# Patient Record
Sex: Male | Born: 1955 | Race: White | Hispanic: No | Marital: Married | Smoking: Former smoker
Health system: Southern US, Community
[De-identification: ages and names within clinical notes are randomized; demographics above are authoritative.]

## PROBLEM LIST (undated history)

## (undated) DIAGNOSIS — E785 Hyperlipidemia, unspecified: Secondary | ICD-10-CM

## (undated) DIAGNOSIS — C439 Malignant melanoma of skin, unspecified: Secondary | ICD-10-CM

## (undated) DIAGNOSIS — K2270 Barrett's esophagus without dysplasia: Secondary | ICD-10-CM

## (undated) DIAGNOSIS — Z87442 Personal history of urinary calculi: Secondary | ICD-10-CM

## (undated) DIAGNOSIS — M1990 Unspecified osteoarthritis, unspecified site: Secondary | ICD-10-CM

## (undated) DIAGNOSIS — C4490 Unspecified malignant neoplasm of skin, unspecified: Secondary | ICD-10-CM

## (undated) DIAGNOSIS — C3490 Malignant neoplasm of unspecified part of unspecified bronchus or lung: Secondary | ICD-10-CM

## (undated) DIAGNOSIS — K219 Gastro-esophageal reflux disease without esophagitis: Secondary | ICD-10-CM

## (undated) DIAGNOSIS — F419 Anxiety disorder, unspecified: Secondary | ICD-10-CM

## (undated) HISTORY — PX: ANKLE SURGERY: SHX546

## (undated) HISTORY — DX: Unspecified malignant neoplasm of skin, unspecified: C44.90

## (undated) HISTORY — DX: Malignant melanoma of skin, unspecified: C43.9

## (undated) HISTORY — PX: ESOPHAGOGASTRODUODENOSCOPY: SHX1529

## (undated) HISTORY — DX: Hyperlipidemia, unspecified: E78.5

## (undated) HISTORY — DX: Barrett's esophagus without dysplasia: K22.70

## (undated) HISTORY — PX: OTHER SURGICAL HISTORY: SHX169

---

## 1999-09-12 ENCOUNTER — Inpatient Hospital Stay (HOSPITAL_COMMUNITY): Admission: EM | Admit: 1999-09-12 | Discharge: 1999-09-14 | Payer: Self-pay | Admitting: Emergency Medicine

## 1999-09-12 ENCOUNTER — Encounter: Payer: Self-pay | Admitting: Emergency Medicine

## 2000-10-16 ENCOUNTER — Encounter: Payer: Self-pay | Admitting: Internal Medicine

## 2000-10-16 ENCOUNTER — Ambulatory Visit (HOSPITAL_COMMUNITY): Admission: RE | Admit: 2000-10-16 | Discharge: 2000-10-16 | Payer: Self-pay | Admitting: Internal Medicine

## 2000-10-30 ENCOUNTER — Encounter: Payer: Self-pay | Admitting: Internal Medicine

## 2000-10-30 ENCOUNTER — Ambulatory Visit (HOSPITAL_COMMUNITY): Admission: RE | Admit: 2000-10-30 | Discharge: 2000-10-30 | Payer: Self-pay | Admitting: Internal Medicine

## 2000-11-13 ENCOUNTER — Encounter: Payer: Self-pay | Admitting: Internal Medicine

## 2000-11-13 ENCOUNTER — Encounter: Admission: RE | Admit: 2000-11-13 | Discharge: 2000-11-13 | Payer: Self-pay | Admitting: Internal Medicine

## 2000-12-23 ENCOUNTER — Ambulatory Visit (HOSPITAL_COMMUNITY): Admission: RE | Admit: 2000-12-23 | Discharge: 2000-12-23 | Payer: Self-pay | Admitting: General Surgery

## 2001-01-28 ENCOUNTER — Emergency Department (HOSPITAL_COMMUNITY): Admission: EM | Admit: 2001-01-28 | Discharge: 2001-01-28 | Payer: Self-pay | Admitting: *Deleted

## 2001-01-28 ENCOUNTER — Encounter: Payer: Self-pay | Admitting: *Deleted

## 2001-02-28 ENCOUNTER — Emergency Department (HOSPITAL_COMMUNITY): Admission: EM | Admit: 2001-02-28 | Discharge: 2001-03-01 | Payer: Self-pay | Admitting: *Deleted

## 2001-02-28 ENCOUNTER — Encounter: Payer: Self-pay | Admitting: *Deleted

## 2001-03-31 ENCOUNTER — Ambulatory Visit (HOSPITAL_COMMUNITY): Admission: RE | Admit: 2001-03-31 | Discharge: 2001-03-31 | Payer: Self-pay | Admitting: Urology

## 2001-03-31 ENCOUNTER — Encounter: Payer: Self-pay | Admitting: Urology

## 2001-10-16 ENCOUNTER — Other Ambulatory Visit: Admission: RE | Admit: 2001-10-16 | Discharge: 2001-10-16 | Payer: Self-pay | Admitting: Dermatology

## 2002-01-09 ENCOUNTER — Encounter: Payer: Self-pay | Admitting: Internal Medicine

## 2002-01-09 ENCOUNTER — Encounter: Admission: RE | Admit: 2002-01-09 | Discharge: 2002-01-09 | Payer: Self-pay | Admitting: Internal Medicine

## 2002-01-23 ENCOUNTER — Encounter: Payer: Self-pay | Admitting: Internal Medicine

## 2002-01-23 ENCOUNTER — Encounter: Admission: RE | Admit: 2002-01-23 | Discharge: 2002-01-23 | Payer: Self-pay | Admitting: Internal Medicine

## 2002-02-05 ENCOUNTER — Encounter: Admission: RE | Admit: 2002-02-05 | Discharge: 2002-02-05 | Payer: Self-pay | Admitting: Internal Medicine

## 2002-02-05 ENCOUNTER — Encounter: Payer: Self-pay | Admitting: Internal Medicine

## 2002-12-08 ENCOUNTER — Ambulatory Visit (HOSPITAL_COMMUNITY): Admission: RE | Admit: 2002-12-08 | Discharge: 2002-12-08 | Payer: Self-pay | Admitting: Internal Medicine

## 2002-12-08 ENCOUNTER — Encounter: Payer: Self-pay | Admitting: Internal Medicine

## 2002-12-31 ENCOUNTER — Ambulatory Visit (HOSPITAL_COMMUNITY): Admission: RE | Admit: 2002-12-31 | Discharge: 2002-12-31 | Payer: Self-pay | Admitting: Internal Medicine

## 2004-10-04 ENCOUNTER — Ambulatory Visit: Payer: Self-pay | Admitting: Internal Medicine

## 2005-04-06 ENCOUNTER — Encounter: Admission: RE | Admit: 2005-04-06 | Discharge: 2005-04-06 | Payer: Self-pay | Admitting: Internal Medicine

## 2005-04-27 ENCOUNTER — Encounter: Admission: RE | Admit: 2005-04-27 | Discharge: 2005-04-27 | Payer: Self-pay | Admitting: Internal Medicine

## 2005-08-16 ENCOUNTER — Encounter: Admission: RE | Admit: 2005-08-16 | Discharge: 2005-08-16 | Payer: Self-pay | Admitting: Neurosurgery

## 2005-08-30 ENCOUNTER — Encounter: Admission: RE | Admit: 2005-08-30 | Discharge: 2005-08-30 | Payer: Self-pay | Admitting: Neurosurgery

## 2006-02-08 ENCOUNTER — Ambulatory Visit (HOSPITAL_COMMUNITY): Admission: RE | Admit: 2006-02-08 | Discharge: 2006-02-08 | Payer: Self-pay | Admitting: Internal Medicine

## 2006-02-08 ENCOUNTER — Encounter (INDEPENDENT_AMBULATORY_CARE_PROVIDER_SITE_OTHER): Payer: Self-pay | Admitting: Specialist

## 2006-02-08 ENCOUNTER — Ambulatory Visit: Payer: Self-pay | Admitting: Internal Medicine

## 2006-09-16 ENCOUNTER — Ambulatory Visit (HOSPITAL_COMMUNITY): Admission: RE | Admit: 2006-09-16 | Discharge: 2006-09-16 | Payer: Self-pay | Admitting: Urology

## 2007-04-04 ENCOUNTER — Ambulatory Visit (HOSPITAL_COMMUNITY): Admission: RE | Admit: 2007-04-04 | Discharge: 2007-04-04 | Payer: Self-pay | Admitting: Internal Medicine

## 2008-10-22 ENCOUNTER — Ambulatory Visit (HOSPITAL_COMMUNITY): Admission: RE | Admit: 2008-10-22 | Discharge: 2008-10-22 | Payer: Self-pay | Admitting: Urology

## 2009-01-10 ENCOUNTER — Ambulatory Visit (HOSPITAL_COMMUNITY): Admission: RE | Admit: 2009-01-10 | Discharge: 2009-01-10 | Payer: Self-pay | Admitting: Family Medicine

## 2010-04-15 ENCOUNTER — Emergency Department (HOSPITAL_COMMUNITY): Admission: EM | Admit: 2010-04-15 | Discharge: 2010-04-15 | Payer: Self-pay | Admitting: Emergency Medicine

## 2010-08-26 ENCOUNTER — Encounter: Payer: Self-pay | Admitting: Internal Medicine

## 2010-10-09 ENCOUNTER — Other Ambulatory Visit (HOSPITAL_COMMUNITY): Payer: Self-pay | Admitting: Internal Medicine

## 2010-10-11 ENCOUNTER — Encounter (HOSPITAL_COMMUNITY): Payer: Self-pay

## 2010-10-11 ENCOUNTER — Ambulatory Visit (HOSPITAL_COMMUNITY)
Admission: RE | Admit: 2010-10-11 | Discharge: 2010-10-11 | Disposition: A | Discharge status: Home / Self Care | Payer: 59 | Source: Ambulatory Visit | Attending: Internal Medicine | Admitting: Internal Medicine

## 2010-10-11 ENCOUNTER — Other Ambulatory Visit (HOSPITAL_COMMUNITY): Payer: Self-pay

## 2010-10-11 DIAGNOSIS — R1031 Right lower quadrant pain: Secondary | ICD-10-CM | POA: Insufficient documentation

## 2010-10-11 DIAGNOSIS — Q619 Cystic kidney disease, unspecified: Secondary | ICD-10-CM | POA: Insufficient documentation

## 2010-10-11 DIAGNOSIS — K5730 Diverticulosis of large intestine without perforation or abscess without bleeding: Secondary | ICD-10-CM | POA: Insufficient documentation

## 2010-10-11 DIAGNOSIS — R1032 Left lower quadrant pain: Secondary | ICD-10-CM | POA: Insufficient documentation

## 2010-10-11 MED ORDER — IOHEXOL 300 MG/ML  SOLN
100.0000 mL | Freq: Once | INTRAMUSCULAR | Status: AC | PRN
  Administered 2010-10-11: 100 mL via INTRAVENOUS

## 2010-11-20 ENCOUNTER — Ambulatory Visit (INDEPENDENT_AMBULATORY_CARE_PROVIDER_SITE_OTHER): Payer: 59 | Admitting: Internal Medicine

## 2010-11-20 DIAGNOSIS — K219 Gastro-esophageal reflux disease without esophagitis: Secondary | ICD-10-CM

## 2010-12-22 NOTE — Op Note (Signed)
NAME:  Ryan Hines, Ryan Hines                         ACCOUNT NO.:  [id number]   MEDICAL RECORD NO.:  [id number]                   PATIENT TYPE:  AMB   LOCATION:  DAY                                  FACILITY:  [facility]   PHYSICIAN:  Lionel December, M.D.                 DATE OF BIRTH:  11-08-1955   DATE OF PROCEDURE:  12/31/2002  DATE OF DISCHARGE:                                 OPERATIVE REPORT   PROCEDURE:  Esophagogastroduodenoscopy.   INDICATIONS FOR PROCEDURE:  The patient is a 54 year old Caucasian male who  was diagnosed with short-segment Barrett's esophagus two years ago; however,  he states he has no symptoms of GERD and presently is on no therapy.  He is  undergoing EGD both for diagnostic and surveillance purposes.  If indeed he  has a Barrett's esophagus, he needs to be on chronic maintenance therapy  with PPI.  The procedure was reviewed with the patient, and informed consent  was obtained.   PREOPERATIVE MEDICATIONS:  Cetacaine spray for pharyngeal topical  anesthesia, Demerol 50 mg IV, Versed 7 mg IV in divided doses.   INSTRUMENT USED:  Olympus video system.   FINDINGS:  The procedure was performed in the endoscopy suite.  The  patient's vital signs and O2 saturations were monitored during the procedure  and remained stable.  The patient was placed in the left lateral position.  The endoscope was passed via the oropharynx without any difficulty into the  esophagus.   Esophagus:  The mucosa of the proximal and middle third was normal.  Distally, he had a short Barrett segment.  The squamocolumnar junction was  wavy with a sawtooth appearance.  The proximal margin was estimated to be  between 34 and 35 cm.  The highest point was felt to be 34 cm.  The salmon-  colored mucosa involved circumferentially for about 2 cm.  The GEJ was  estimated to be at 37, and the diaphragmatic hiatus was at 40 cm from the  incisors.  There were no erosions, ulcerations, or masses.   Multiple  pictures were taken for documentation, and biopsies were taken looking for  dyplasia on the way out.   Stomach:  It was empty and distended very well with insufflation.  The folds  of the proximal stomach were normal.  Examination of the mucosa at gastric  body, antrum, pyloric channel, as well as angularis, fundus, and cardia was  normal.   Duodenum:  Examination of the bulb and second part of the duodenum was also  normal.  The endoscope was withdrawn, and on the way out, multiple biopsies  were taken from this Barrett's segment.  The patient tolerated the procedure  well.   FINAL DIAGNOSES:  1. Short-segment Barrett's esophagus.  It is 3 cm in length, but part of it     has a sawtooth appearance.  2. Small sliding hiatal hernia which was  3 cm in length.  3. Diaphragmatic hiatus was at 40.  Gastroesophageal junction estimated to     be 37.  The most proximal margin of Barrett's was at 34.  4. Normal examination of stomach and duodenum.   RECOMMENDATIONS:  1. Antireflux measures reinforced.  2. Will start him on omeprazole 20 mg p.o. q.a.m.  Prescription given for a     month with 11 refills.  3. I will contact the patient with biopsy results.  If there is no     dysplasia, he will return for another exam in three years from now.                                               Lionel December, M.D.    NR/MEDQ  D:  12/31/2002  T:  12/31/2002  Job:  [id number]   cc:   Kingsley Callander. Ouida Sills, M.D.  [address]  [place]  [place] [address]  Fax: [phone]

## 2010-12-22 NOTE — Procedures (Signed)
[place]  Patient:    Ryan Hines, Ryan Hines                      MRN: [id number] Proc. Date: 09/14/99 Adm. Date:  [id number] Disc. Date: [id number] Attending:  Leanord Hawking                           Procedure Report  PROCEDURE: 1. Left heart catheterization. 2. Coronary angiography.  INDICATIONS FOR PROCEDURE:  Persistent chest pain typical of angina.  PROCEDURE:  After obtaining written informed consent the patient was brought to the cardiac catheterization lab in a post absorptive state.  Preoperative sedation as achieved using IV Versed and IV Benadryl.  Both groins were prepped and draped n the usual sterile fashion.  Local anesthesia was achieved using 1% Xylocaine. 6 [place] Hemostasis sheath was placed into the right femoral artery using modified Seldinger technique.  Selective coronary angiography was performed using a JL4, JR4 Judkins catheter.  Non-ionic contrast was used and was hand injected.  A single planned ventriculogram was performed in the RAO position using a 6 French straight catheter.  Non-ionic contrast was used and was powered injected.  All catheter exchanges were made over a guidewire and a hemostasis sheath was placed with each catheter exchange.  Following review of the film there was no identifiable coronary artery disease.  The hemostasis sheath was removed.  Hemostasis was achieved using digital pressure.  FINDINGS:  The aortic pressure was 104/18.  LV pressure is 110/67.  There was no gradient noted on pull back.  Single planned ventriculogram revealed normal wall motion.  There was no mitral  regurgitation noted.  CORONARY ANGIOGRAPHY:  The left main coronary artery bifurcated into the left anterior descending and a circumflex vessel.  There was no significant disease n the left main coronary artery.  LEFT ANTERIOR DESCENDING:  The left anterior descending gave rise to a moderate  size  D1, small D2, small D3, small D4, and a distant apical recurrent branch. There was no significant disease in the left anterior descending nor its branches.  CIRCUMFLEX VESSELS:  The circumflex vessel gave rise to a large OM1, large OM2 nd a distant PL branch.  It was codominant for the posterior circulation.  There was no significant disease in the circumflex branch.  RIGHT CORONARY ARTERY:  The right coronary artery is codominant and gave rise to a small RV marginal and a moderate sized PDA.  IMPRESSION: 1. Normal coronary arteries. 2. Normal LV gram.  RECOMMENDATION:  Consider other etiologies for his chest pain. DD:  09/14/99 TD:  09/14/99 Job: [id number]

## 2010-12-22 NOTE — Discharge Summary (Signed)
[place]. [facility]  Patient:    Ryan Hines, Ryan Hines                      MRN: [id number] Adm. Date:  [id number] Disc. Date: 09/13/99 Attending:  Leanord Hawking CC:         Dr. Huntley Dec, [place]                           Discharge Summary  DISCHARGE DIAGNOSES:  1. Atypical chest pain.  2. Costochondritis.  3. Hyperlipidemia.  HISTORY:  Mr. Leitz is a 55 year old white male admitted after about 12 hours f left interior sharp chest wall pain described as a 5/10 initially, radiating to the left shoulder.  No associated shortness of breath, diaphoresis, nausea or vomiting. The pain seemed to go down from about 5/10 to 2/10 after one sublingual nitroglycerin.  EKG was unremarkable.  Chest x-ray was unremarkable.  The patient described no heartburn or reflux disease or dysphagia symptoms.  On exam, was only remarkable for pinpointed left chest wall anterior tenderness which seemed to reproduce his pain.  The patients CPKs were unremarkable.  Troponins were negative.  He ruled out for an MI.  CBC and BMET were unremarkable as well. The patient did have urine which showed some calcium oxalate crystals and microscopic hematuria.  This may indicate urolithiasis.  However, the patient was completely asymptomatic.  The patient is to be discharged today pending a negative stress Cardiolite.  DISCHARGE MEDICATIONS:  1. Pravachol 40 mg q.h.s. (which he had been on before).  2. Naprosyn 500 mg b.i.d. p.r.n. chest wall pain.  I will make a request for stress Cardiolite to Holly Bodily, nurse practitioner for Dr. Corliss Marcus. DD:  09/13/99 TD:  09/13/99 Job: [id number]

## 2010-12-22 NOTE — H&P (Signed)
[place]. [facility]  Patient:    Ryan Hines, Ryan Hines                      MRN: [id number] Adm. Date:  [id number] Attending:  Leanord Hawking CC:         Kingsley Callander. Nicki Reaper, M.D., [place], [place]                         History and Physical  CHIEF COMPLAINT:  Chest pain.  HISTORY OF PRESENT ILLNESS:  [name] is a very pleasant 55 year old white male patient.  His primary care doctor is Dr. Kingsley Callander. [place], III in [place].  He is admitted tonight via unassigned rotation list.  His last visit with Dr. Ouida Sills was two to three months ago.  He does not have history of chest pain.  He experienced the onset of chest pain  about 9 oclock this morning as he was waking up.  He describes focal left anterior chest pain, which is sharp in nature.  The pain is not positional or pleuritic. No associated shortness of breath, palpitations, nausea, vomiting or diaphoresis. The pain does radiate towards his left shoulder.  Initially, pain was rated 5/10 in  severity and fell to 2-3/10 with sublingual nitroglycerin given once in the ER. He still rates the pain as 2/10.  ALLERGIES:  None.  PRESENT MEDICATIONS:   Pravachol 40 mg q.h.s.  PAST MEDICAL HISTORY: 1. Hyperlipidemia. 2. Tobacco abuse. 3. Obesity. 4. History of crack abuse.  Apparently discontinued about six years ago after two    stays at [facility].  SURGERIES:  None.  FAMILY HISTORY:  Father died at age 48 with an MI.  Mother, age 3, is healthy.  Three brothers are healthy.  SOCIAL HISTORY:  Does not drink alcohol now.  Smokes two packs a day and has smoked for 26 years.  He has been married for 25 years.  Two children, ages 102 and 46, are healthy.  He works at [employer].  PHYSICAL EXAMINATION:  VITAL SIGNS:  Temperature 98.9, blood pressure 142/86, pulse 81, respiratory rate 18, O2 saturation 97%.  GENERAL:  He is an alert, cooperative white male in no acute  distress.  HEENT:  Normocephalic, atraumatic.  PERRL.  EOMs are intact.  Fundi okay.  TMs okay.  Posterior oropharynx is clear.  NECK:  Supple, without JVD or carotid bruits.  CHEST:  Clear to auscultation bilaterally.  He does have focal tenderness to palpation along the left anterior chest wall.  HEART:  Regular rate and rhythm, without murmur, rub or gallop.  ABDOMEN:  Soft, obese, nontender and nondistended.  No organomegaly, masses or bruits.  Benign exam.  GU:  Exam deferred upon admission.  RECTAL:  Exam deferred upon admission.  EXTREMITIES:  Dorsalis pedis pulse 2+ bilaterally.  No edema.  NEUROLOGIC:  Exam is nonfocal.  LABORATORY AND X-RAY FINDINGS:  Chest x-ray is okay.  EKG reveals normal sinus rhythm without ischemic changes.  CBC, basic metabolic profile, troponin and CK-MB are okay/normal.  ASSESSMENT AND PLAN:  Atypical chest pain with multiple cardiac risk factors including family history, male gender, hyperlipidemia, tobacco abuse and obesity. Will admit to telemetry and rule out for myocardial infarction with serial enzymes and electrocardiograms.  He will likely need a stress Cardiolite for further evaluation, if he does rule out for myocardial infarction.  I strongly encouraged complete smoking cessation and he acknowledges the fact  he should quit smoking.   I have discussed admission plans with the patient and his wife; they are agreeable. DD:  09/12/99 TD:  09/13/99 Job: [id number]

## 2010-12-22 NOTE — Op Note (Signed)
Ryan Hines, Ryan Hines               ACCOUNT NO.:  [id number]   MEDICAL RECORD NO.:  [id number]          PATIENT TYPE:  AMB   LOCATION:  DAY                           FACILITY:  [facility]   PHYSICIAN:  Lionel December, M.D.    DATE OF BIRTH:  [birth year]   DATE OF PROCEDURE:  02/08/2006  DATE OF DISCHARGE:                                  PROCEDURE NOTE   PROCEDURE:  Esophagogastroduodenoscopy followed by colonoscopy.   ENDOSCOPIST:  Lionel December, M.D.   INDICATIONS:  Ryan Hines is a 55 year old Caucasian male who has known Barrett's  esophagus and he is therefore undergoing surveillance EGD.  His last exam  was in May 2004.  He is on antireflux measures and Nexium and he says his  heartburn is very well-controlled.  He denies dysphagia.  He also has  history of colonic polyps and undergoing surveillance colonoscopy.  His last  exam was 4 or 5 years ago.  Procedure and risks were reviewed with the  patient and informed consent was obtained.   MEDICATIONS FOR CONSCIOUS SEDATION:  Benzocaine spray for oropharyngeal  topical anesthesia, Demerol 50 mg IV, Versed 10 mg IV.   FINDINGS:  Procedure performed in endoscopy suite.  The patient's vital  signs and O2 SATs were monitored during the procedure and remained stable.   PROCEDURE:   1. ESOPHAGOGASTRODUODENOSCOPY:  The patient was placed in the left lateral  decubitus position and Olympus videoscope was passed via oropharynx without  any difficulty into esophagus.   ESOPHAGUS:  Mucosa of the proximal and middle third was normal.  Proximal  margin of Barrett's was at 36.5 cm from the incisors.  Proximal margin was  serrated.  Barrett's mucosa extended down to GE junction, which was at 40  cm.  There were multiple small squamous islands.  No stricture or ulceration  were noted.  Hiatus was at 40.   STOMACH:  It was empty and distended very well with insufflation.  Folds in  the proximal stomach were normal.  Examination of the mucosa at body,  antrum  and pyloric channel as well as angularis, fundus and cardia was normal.   DUODENUM:  Bulbar mucosa was normal.  Scope was passed into the second part  of the duodenum, where mucosa and folds were normal.  Endoscope was  withdrawn.   Multiple biopsies were taken from Barrett's mucosa and submitted in 1  container.  Endoscope was withdrawn.  The patient was prepared for procedure  #2.   2. COLONOSCOPY:  Rectal examination was performed.  No abnormality was noted  on external or digital exam.  Olympus videoscope was placed in the rectum  and advanced under direct vision into sigmoid colon and beyond.  Preparation  was satisfactory.  A few scattered diverticula were noted in the sigmoid  colon, all of which were small.  The scope was passed into the cecum, which  was identified by appendiceal orifice and ileocecal valve.  There was a  small diverticulum at ascending colon just above the ileocecal valve.  As  the scope was withdrawn, colonic mucosa was carefully examined and  no polyps  and/or tumor masses were noted.  Rectal mucosa was normal.  Scope was  retroflexed to examine anorectal junction and small hemorrhoids were noted  below the dentate line.  Endoscope was straightened and withdrawn.  The  patient tolerated the procedure well.   FINAL DIAGNOSES:  A 3.5-cm-long segment of Barrett's esophagus, which is  essentially unchanged since previous study of about 3 years ago, May 2004.  Small sliding hiatal hernia .  Multiple biopsies taken, looking for  dysplasia.   Small sliding hiatal hernia.   A few scattered diverticula at sigmoid colon with 1 at ascending colon and  external hemorrhoids.  No evidence of recurrent polyps.   RECOMMENDATIONS:  I will be contacting patient with biopsy results.  He will  continue antireflux measures and Nexium as before.      Lionel December, M.D.  Electronically Signed     NR/MEDQ  D:  02/08/2006  T:  02/08/2006  Job:  [id number]   cc:    Kingsley Callander. Ouida Sills, MD  Fax: [phone]

## 2011-08-17 ENCOUNTER — Other Ambulatory Visit: Payer: Self-pay | Admitting: Physician Assistant

## 2011-09-26 ENCOUNTER — Other Ambulatory Visit: Payer: Self-pay | Admitting: Physician Assistant

## 2011-12-27 ENCOUNTER — Telehealth (INDEPENDENT_AMBULATORY_CARE_PROVIDER_SITE_OTHER): Payer: Self-pay | Admitting: *Deleted

## 2011-12-27 MED ORDER — ESOMEPRAZOLE MAGNESIUM 40 MG PO CPDR: 40.0000 mg | DELAYED_RELEASE_CAPSULE | Freq: Every day | ORAL | Status: DC

## 2011-12-27 NOTE — Telephone Encounter (Signed)
Patient needs refill nexium 40 mg # 30 daily

## 2011-12-27 NOTE — Telephone Encounter (Signed)
Rx eprescribed to pharmacy

## 2012-01-16 ENCOUNTER — Encounter (INDEPENDENT_AMBULATORY_CARE_PROVIDER_SITE_OTHER): Payer: Self-pay | Admitting: *Deleted

## 2012-01-28 ENCOUNTER — Ambulatory Visit (INDEPENDENT_AMBULATORY_CARE_PROVIDER_SITE_OTHER): Payer: 59 | Admitting: Internal Medicine

## 2012-01-28 ENCOUNTER — Encounter (INDEPENDENT_AMBULATORY_CARE_PROVIDER_SITE_OTHER): Payer: Self-pay | Admitting: Internal Medicine

## 2012-01-28 VITALS — BP 110/70 | HR 72 | Temp 99.2°F | Ht 73.0 in | Wt 255.8 lb

## 2012-01-28 DIAGNOSIS — E785 Hyperlipidemia, unspecified: Secondary | ICD-10-CM

## 2012-01-28 DIAGNOSIS — K2270 Barrett's esophagus without dysplasia: Secondary | ICD-10-CM

## 2012-01-28 NOTE — Patient Instructions (Addendum)
Continue nexium. OV in 1 yrs.

## 2012-01-28 NOTE — Progress Notes (Signed)
Subjective:     Patient ID: Ryan Hines, male   DOB: 06-27-1956, 56 y.o.   MRN: [id number]  HPI Says he is not having any reflux. Presently taking Nexium for his GERD.  See below. Appetite is good. No weight loss.. He has actually gained 10 pounds since his last visit. He smokes very few cigarettes. He has a BM 2-3 times a day. No rectal bleeding.  Goes to [facility] in July surveillance EGD for hx of Barrett's.  Known history of Barrett's esophagus with persistent low grade dysplasia. Sent to Dr.  Alvia Grove of [place], [place].  He has u ndergone 4 EGDs. He had RFA therapy on his first 2 endoscopies.  His last exam wa in January of 2012 and he was told that he did not have any Barrett's per Dr. Karilyn Cota dictated notes.   Review of Systems see hpi Current Outpatient Prescriptions  Medication Sig Dispense Refill  . ALPRAZolam (XANAX) 1 MG tablet Take 1 mg by mouth at bedtime as needed.      Marland Kitchen aspirin 81 MG tablet Take 81 mg by mouth daily.      . diclofenac (VOLTAREN) 75 MG EC tablet Take 75 mg by mouth 2 (two) times daily.      Marland Kitchen esomeprazole (NEXIUM) 40 MG capsule Take 1 capsule (40 mg total) by mouth daily.  30 capsule  7  . simvastatin (ZOCOR) 40 MG tablet Take 40 mg by mouth every evening.      . Tamsulosin HCl (FLOMAX) 0.4 MG CAPS Take by mouth.       History reviewed. No pertinent past surgical history. History   Social History  . Marital Status: Married    Spouse Name: N/A    Number of Children: N/A  . Years of Education: N/A   Occupational History  . Not on file.   Social History Main Topics  . Smoking status: Current Everyday Smoker  . Smokeless tobacco: Not on file   Comment: ver few.  . Alcohol Use: Not on file  . Drug Use: Not on file  . Sexually Active: Not on file   Other Topics Concern  . Not on file   Social History Narrative  . No narrative on file   Family Status  Relation Status Death Age  . Mother Alive     good health  . Father Deceased    Prostate cancer  . Brother Deceased     One deceased from throat cancer. One in good contion.   Allergies no known allergies          Objective:   Physical Exam Filed Vitals:   01/28/12 1016  Height: 6\' 1"  (1.854 m)  Weight: 255 lb 12.8 oz (116.03 kg)  Alert and oriented. Skin warm and dry. Oral mucosa is moist.   . Sclera anicteric, conjunctivae is pink. Thyroid not enlarged. No cervical lymphadenopathy. Lungs clear. Heart regular rate and rhythm.  Abdomen is soft. Bowel sounds are positive. No hepatomegaly. No abdominal masses felt. No tenderness. Abdomen is obese  No edema to lower extremities.  .       Assessment:   GERD controlled a this time.. Barrett's esophagus without evidence of on last endoscopy in July of 2012.    Plan:    Continue Nexium. Keep appt at [facility] for surveillance EGD. . OV in 1 yr.

## 2012-06-23 ENCOUNTER — Encounter (INDEPENDENT_AMBULATORY_CARE_PROVIDER_SITE_OTHER): Payer: Self-pay

## 2012-08-30 ENCOUNTER — Other Ambulatory Visit (INDEPENDENT_AMBULATORY_CARE_PROVIDER_SITE_OTHER): Payer: Self-pay | Admitting: Internal Medicine

## 2012-11-17 ENCOUNTER — Other Ambulatory Visit: Payer: Self-pay | Admitting: Dermatology

## 2012-12-18 ENCOUNTER — Other Ambulatory Visit: Payer: Self-pay | Admitting: Dermatology

## 2013-01-08 ENCOUNTER — Encounter (INDEPENDENT_AMBULATORY_CARE_PROVIDER_SITE_OTHER): Payer: Self-pay | Admitting: *Deleted

## 2013-01-27 ENCOUNTER — Ambulatory Visit (INDEPENDENT_AMBULATORY_CARE_PROVIDER_SITE_OTHER): Payer: 59 | Admitting: Internal Medicine

## 2013-03-02 ENCOUNTER — Ambulatory Visit (INDEPENDENT_AMBULATORY_CARE_PROVIDER_SITE_OTHER): Payer: 59 | Admitting: Internal Medicine

## 2013-03-03 ENCOUNTER — Encounter (INDEPENDENT_AMBULATORY_CARE_PROVIDER_SITE_OTHER): Payer: Self-pay | Admitting: Internal Medicine

## 2013-03-03 ENCOUNTER — Ambulatory Visit (INDEPENDENT_AMBULATORY_CARE_PROVIDER_SITE_OTHER): Payer: 59 | Admitting: Internal Medicine

## 2013-03-03 VITALS — BP 142/70 | HR 70 | Temp 97.9°F | Ht 72.0 in | Wt 272.2 lb

## 2013-03-03 DIAGNOSIS — K2270 Barrett's esophagus without dysplasia: Secondary | ICD-10-CM

## 2013-03-03 NOTE — Patient Instructions (Addendum)
OV in 1 yr. Continue the Nexium. 

## 2013-03-03 NOTE — Progress Notes (Signed)
Subjective:     Patient ID: Ryan Hines, male   DOB: 10/03/1955, 57 y.o.   MRN: [id number]  HPI Hx of complicated GERD.  Ryan Hines is a 57 yr old male here today for f/u of his Barrett's esophagus. HPI Says he is not having any reflux. Presently taking Nexium for his GERD. See below.  Appetite is good. No weight loss. He has gained 22 pounds since he quit smoking. He has a BM 2-3 times a day. No rectal bleeding.   He goes back to [facility] in September fo ra surveillance EGD for Barrett's. Acid reflux is controlled.    Known history of Barrett's esophagus with persistent low grade dysplasia. Sent to Dr. Alvia Grove of [place], [place]. He has undergone 4 EGDs. He had RFA therapy on his first 2 endoscopies. We do not have records from [facility]  Review of Systems See hpi Current Outpatient Prescriptions  Medication Sig Dispense Refill  . ALPRAZolam (XANAX) 1 MG tablet Take 1 mg by mouth at bedtime as needed.      Marland Kitchen aspirin 81 MG tablet Take 81 mg by mouth daily.      . diclofenac (VOLTAREN) 75 MG EC tablet Take 75 mg by mouth 2 (two) times daily.      Marland Kitchen NEXIUM 40 MG capsule take 1 capsule by mouth once daily  30 each  11  . simvastatin (ZOCOR) 40 MG tablet Take 40 mg by mouth every evening.      . Tamsulosin HCl (FLOMAX) 0.4 MG CAPS Take by mouth.       No current facility-administered medications for this visit.   Past Medical History  Diagnosis Date  . Barrett's esophagus   . Hyperlipidemia    History reviewed. No pertinent past surgical history.     Objective:   Physical Exam  Filed Vitals:   03/03/13 1122  BP: 142/70  Pulse: 70  Temp: 97.9 F (36.6 C)  Height: 6' (1.829 m)  Weight: 272 lb 3.2 oz (123.469 kg)   Alert and oriented. Skin warm and dry. Oral mucosa is moist.   . Sclera anicteric, conjunctivae is pink. Thyroid not enlarged. No cervical lymphadenopathy. Lungs clear. Heart regular rate and rhythm.  Abdomen is soft. Bowel sounds are positive. No  hepatomegaly. No abdominal masses felt. Abdomen is obese No tenderness.  1+ edema to lower extremities.        Assessment:    Barrett's esophagus. He seems to be doing well.  No acid reflux.    Plan:    OV in 1 yr. Keep apt with [facility]. If any problems he will call our office.

## 2013-03-10 ENCOUNTER — Ambulatory Visit (INDEPENDENT_AMBULATORY_CARE_PROVIDER_SITE_OTHER): Payer: 59 | Admitting: Internal Medicine

## 2013-03-10 ENCOUNTER — Encounter (INDEPENDENT_AMBULATORY_CARE_PROVIDER_SITE_OTHER): Payer: Self-pay | Admitting: Internal Medicine

## 2013-03-10 VITALS — BP 132/82 | HR 52 | Ht 72.0 in | Wt 270.1 lb

## 2013-03-10 DIAGNOSIS — R109 Unspecified abdominal pain: Secondary | ICD-10-CM

## 2013-03-10 DIAGNOSIS — C439 Malignant melanoma of skin, unspecified: Secondary | ICD-10-CM | POA: Insufficient documentation

## 2013-03-10 DIAGNOSIS — C4490 Unspecified malignant neoplasm of skin, unspecified: Secondary | ICD-10-CM | POA: Insufficient documentation

## 2013-03-10 NOTE — Patient Instructions (Addendum)
CT abdomen/pelvis with CM. Further recommendations to follow. Patient has pain medication at home.

## 2013-03-10 NOTE — Progress Notes (Signed)
Subjective:     Patient ID: Ryan Hines, male   DOB: Sep 05, 1955, 57 y.o.   MRN: [id number]  HPIPresents today with c/o rt and left flank pain. Right flank is worse than the left. Pain for a couple of days. Denies any bending over.  No fever associated with symptoms. Rates pain at a 5.  Started all of a sudden. No urinary symptoms. Feel like someone hit him with a ball bat. BMs are normal. No melena or bright red rectal bleeding.  Hx of similar episode in 2010. Please see CT for 2010.    10/22/2008 CT abdomen/pelvis with CM: flank pain, hematuria. IMPRESSION:  Cyst at inferior pole of left kidney, 3.4 cm diameter, showing no  complicating features.  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  No acute upper abdominal abnormalities    02/08/2006 EGD/Colonoscopy: Hx of polyps. patient tolerated the procedure well.  FINAL DIAGNOSES: A 3.5-cm-long segment of Barrett's esophagus, which is  essentially unchanged since previous study of about 3 years ago, May 2004.  Small sliding hiatal hernia . Multiple biopsies taken, looking for  dysplasia.  Small sliding hiatal hernia.  A few scattered diverticula at sigmoid colon with 1 at ascending colon and  external hemorrhoids. No evidence of recurrent polyps.   Review of Systems see hpi     History reviewed. No pertinent past surgical history.  No Known Allergies Current Outpatient Prescriptions on File Prior to Visit  Medication Sig Dispense Refill  . ALPRAZolam (XANAX) 1 MG tablet Take 1 mg by mouth at bedtime as needed.      Marland Kitchen aspirin 81 MG tablet Take 81 mg by mouth daily.      . diclofenac (VOLTAREN) 75 MG EC tablet Take 75 mg by mouth 2 (two) times daily.      Marland Kitchen NEXIUM 40 MG capsule take 1 capsule by mouth once daily  30 each  11  . simvastatin (ZOCOR) 40 MG tablet Take 40 mg by mouth every evening.      . Tamsulosin HCl (FLOMAX) 0.4 MG CAPS Take by mouth.       No current  facility-administered medications on file prior to visit.    Objective:   Physical Exam  Filed Vitals:   03/10/13 1033  BP: 132/82  Pulse: 52  Height: 6' (1.829 m)  Weight: 270 lb 1.6 oz (122.517 kg)  Alert and oriented. Skin warm and dry. Oral mucosa is moist.   . Sclera anicteric, conjunctivae is pink. Thyroid not enlarged. No cervical lymphadenopathy. Lungs clear. Heart regular rate and rhythm.  Abdomen is soft. Bowel sounds are positive. No hepatomegaly. No abdominal masses felt.Tenderness rt and left flank.  No edema to lower extremities.        Assessment:    flank pain. Doubt this is GI origin. I will however order a CT to be sure he does not have a kidney stone. Last colonoscopy in 2007 and was normal.     Plan:    CT abdomen/pelvis with CM. Further recommendations to follow. Rx for Vicodin offered. Patient declined. He has a refill on an Rx for this at home.

## 2013-03-12 ENCOUNTER — Telehealth (INDEPENDENT_AMBULATORY_CARE_PROVIDER_SITE_OTHER): Payer: Self-pay | Admitting: Internal Medicine

## 2013-03-12 NOTE — Telephone Encounter (Signed)
Opened in error

## 2013-03-13 ENCOUNTER — Ambulatory Visit (HOSPITAL_COMMUNITY): Payer: 59

## 2013-03-16 ENCOUNTER — Ambulatory Visit (HOSPITAL_COMMUNITY): Admission: RE | Admit: 2013-03-16 | Payer: 59 | Source: Ambulatory Visit

## 2013-08-26 ENCOUNTER — Other Ambulatory Visit (INDEPENDENT_AMBULATORY_CARE_PROVIDER_SITE_OTHER): Payer: Self-pay | Admitting: Internal Medicine

## 2013-12-21 ENCOUNTER — Other Ambulatory Visit: Payer: Self-pay

## 2014-02-10 ENCOUNTER — Encounter (INDEPENDENT_AMBULATORY_CARE_PROVIDER_SITE_OTHER): Payer: Self-pay | Admitting: *Deleted

## 2014-04-09 ENCOUNTER — Ambulatory Visit (INDEPENDENT_AMBULATORY_CARE_PROVIDER_SITE_OTHER): Payer: 59 | Admitting: Internal Medicine

## 2014-04-09 ENCOUNTER — Other Ambulatory Visit (INDEPENDENT_AMBULATORY_CARE_PROVIDER_SITE_OTHER): Payer: Self-pay | Admitting: *Deleted

## 2014-04-09 ENCOUNTER — Telehealth (INDEPENDENT_AMBULATORY_CARE_PROVIDER_SITE_OTHER): Payer: Self-pay | Admitting: *Deleted

## 2014-04-09 ENCOUNTER — Encounter (INDEPENDENT_AMBULATORY_CARE_PROVIDER_SITE_OTHER): Payer: Self-pay | Admitting: Internal Medicine

## 2014-04-09 VITALS — BP 130/82 | HR 64 | Temp 98.1°F | Ht 72.0 in | Wt 263.9 lb

## 2014-04-09 DIAGNOSIS — Z1211 Encounter for screening for malignant neoplasm of colon: Secondary | ICD-10-CM

## 2014-04-09 DIAGNOSIS — R195 Other fecal abnormalities: Secondary | ICD-10-CM

## 2014-04-09 DIAGNOSIS — K625 Hemorrhage of anus and rectum: Secondary | ICD-10-CM | POA: Insufficient documentation

## 2014-04-09 MED ORDER — PEG-KCL-NACL-NASULF-NA ASC-C 100 G PO SOLR: 1.0000 | Freq: Once | ORAL | Status: DC

## 2014-04-09 NOTE — Telephone Encounter (Signed)
Patient needs movi prep 

## 2014-04-09 NOTE — Progress Notes (Signed)
   Subjective:    Patient ID: Ryan Hines, male    DOB: 08/27/1955, 58 y.o.   MRN: [id number]  HPI Seen at [facility] recently for a routine physical in July. Rectal exam revealed +blood. Patient has seen blood on occasion. If he eats sesame seeds or nature bars or anything high fiber, he will see blood. He underwent a  EGD with RFA therapy at [facility] 3-4 yrs ago.  Last EGD this year at [facility] which he reports was normal.  Appetite is okay. Has lost 7-8 pounds from walking. (270 last year's office visit) There is no dysphagia.  Usually has a BM 1-2 a day. Occasionally see blood. Sometimes he has bilateral abdominal pain (later). GERD controlled with Nexium   EGD/Colonoscopy 2007. FINAL DIAGNOSES: A 3.5-cm-long segment of Barrett's esophagus, which is  essentially unchanged since previous study of about 3 years ago, May 2004.  Small sliding hiatal hernia . Multiple biopsies taken, looking for  dysplasia.  Small sliding hiatal hernia.  A few scattered diverticula at sigmoid colon with 1 at ascending colon and  external hemorrhoids. No evidence of recurrent polyps. ESOPHAGUS, BIOPSIES: INTESTINAL METAPLASIA CONSISTENT WITH BARRETT' S ESOPHAGUS ASSOCIATED WITH FOCAL GLANDULAR ATYPIA. SEE    Review of Systems Past Medical History  Diagnosis Date  . Barrett's esophagus   . Hyperlipidemia   . Melanoma     face  . Skin cancer     facial    History reviewed. No pertinent past surgical history.  No Known Allergies  Current Outpatient Prescriptions on File Prior to Visit  Medication Sig Dispense Refill  . ALPRAZolam (XANAX) 1 MG tablet Take 1 mg by mouth at bedtime as needed.      Marland Kitchen aspirin 81 MG tablet Take 81 mg by mouth daily.      Marland Kitchen NEXIUM 40 MG capsule take 1 capsule by mouth once daily  30 capsule  11  . simvastatin (ZOCOR) 40 MG tablet Take 40 mg by mouth every evening.      . Tamsulosin HCl (FLOMAX) 0.4 MG CAPS Take by mouth.       No current  facility-administered medications on file prior to visit.        Objective:   Physical Exam  Filed Vitals:   04/09/14 0904  BP: 130/82  Pulse: 64  Temp: 98.1 F (36.7 C)  Height: 6' (1.829 m)  Weight: 263 lb 14.4 oz (119.704 kg)    Alert and oriented. Skin warm and dry. Oral mucosa is moist.   . Sclera anicteric, conjunctivae is pink. Thyroid not enlarged. No cervical lymphadenopathy. Bilateral wheezes. Heart regular rate and rhythm.  Abdomen is soft. Bowel sounds are positive. No hepatomegaly. No abdominal masses felt. No tenderness.  No edema to lower extremities.         Assessment & Plan:  Guaiac positive stool on recent physical exam by PCP. Colonic AVM, polyp, ulcer, neoplasm needs to be ruled out.

## 2014-04-09 NOTE — Patient Instructions (Signed)
Colonoscopy.  The risks and benefits such as perforation, bleeding, and infection were reviewed with the patient and is agreeable. 

## 2014-04-14 ENCOUNTER — Encounter (HOSPITAL_COMMUNITY): Payer: Self-pay | Admitting: Pharmacy Technician

## 2014-04-30 ENCOUNTER — Ambulatory Visit (HOSPITAL_COMMUNITY)
Admission: RE | Admit: 2014-04-30 | Discharge: 2014-04-30 | Disposition: A | Discharge status: Home / Self Care | Payer: 59 | Source: Ambulatory Visit | Attending: Internal Medicine | Admitting: Internal Medicine

## 2014-04-30 ENCOUNTER — Encounter (HOSPITAL_COMMUNITY)
Admission: RE | Disposition: A | Discharge status: Home / Self Care | Payer: Self-pay | Source: Ambulatory Visit | Attending: Internal Medicine

## 2014-04-30 ENCOUNTER — Encounter (HOSPITAL_COMMUNITY): Payer: Self-pay | Admitting: *Deleted

## 2014-04-30 DIAGNOSIS — Z87891 Personal history of nicotine dependence: Secondary | ICD-10-CM | POA: Insufficient documentation

## 2014-04-30 DIAGNOSIS — C44319 Basal cell carcinoma of skin of other parts of face: Secondary | ICD-10-CM | POA: Insufficient documentation

## 2014-04-30 DIAGNOSIS — K644 Residual hemorrhoidal skin tags: Secondary | ICD-10-CM | POA: Insufficient documentation

## 2014-04-30 DIAGNOSIS — E785 Hyperlipidemia, unspecified: Secondary | ICD-10-CM | POA: Insufficient documentation

## 2014-04-30 DIAGNOSIS — Z86010 Personal history of colonic polyps: Secondary | ICD-10-CM

## 2014-04-30 DIAGNOSIS — Z79899 Other long term (current) drug therapy: Secondary | ICD-10-CM | POA: Insufficient documentation

## 2014-04-30 DIAGNOSIS — D126 Benign neoplasm of colon, unspecified: Secondary | ICD-10-CM | POA: Insufficient documentation

## 2014-04-30 DIAGNOSIS — K921 Melena: Secondary | ICD-10-CM | POA: Insufficient documentation

## 2014-04-30 DIAGNOSIS — R195 Other fecal abnormalities: Secondary | ICD-10-CM

## 2014-04-30 DIAGNOSIS — K5730 Diverticulosis of large intestine without perforation or abscess without bleeding: Secondary | ICD-10-CM | POA: Diagnosis not present

## 2014-04-30 DIAGNOSIS — Z7982 Long term (current) use of aspirin: Secondary | ICD-10-CM | POA: Insufficient documentation

## 2014-04-30 HISTORY — PX: COLONOSCOPY: SHX5424

## 2014-04-30 HISTORY — DX: Gastro-esophageal reflux disease without esophagitis: K21.9

## 2014-04-30 SURGERY — COLONOSCOPY
Anesthesia: Moderate Sedation

## 2014-04-30 MED ORDER — SODIUM CHLORIDE 0.9 % IV SOLN
INTRAVENOUS | Status: DC
  Administered 2014-04-30: 12:00:00 via INTRAVENOUS

## 2014-04-30 MED ORDER — MEPERIDINE HCL 50 MG/ML IJ SOLN
INTRAMUSCULAR | Status: DC | PRN
  Administered 2014-04-30 (×2): 25 mg via INTRAVENOUS

## 2014-04-30 MED ORDER — MIDAZOLAM HCL 5 MG/5ML IJ SOLN
INTRAMUSCULAR | Status: DC | PRN
Start: 2014-04-30 — End: 2014-04-30
  Administered 2014-04-30 (×2): 2 mg via INTRAVENOUS
  Administered 2014-04-30: 1 mg via INTRAVENOUS
  Administered 2014-04-30: 3 mg via INTRAVENOUS
  Administered 2014-04-30: 2 mg via INTRAVENOUS

## 2014-04-30 MED ORDER — MIDAZOLAM HCL 5 MG/5ML IJ SOLN
INTRAMUSCULAR | Status: AC
  Filled 2014-04-30: qty 10

## 2014-04-30 MED ORDER — MEPERIDINE HCL 50 MG/ML IJ SOLN
INTRAMUSCULAR | Status: AC
  Filled 2014-04-30: qty 1

## 2014-04-30 MED ORDER — STERILE WATER FOR IRRIGATION IR SOLN
Status: DC | PRN
  Administered 2014-04-30: 13:00:00

## 2014-04-30 NOTE — Op Note (Signed)
COLONOSCOPY PROCEDURE REPORT  PATIENT:  Ryan Hines  MR#:  [id number] Birthdate:  [birth year], 58 y.o., male Endoscopist:  Dr. Rogene Houston, MD Referred By:  Dr. Purvis Kilts, MD  Procedure Date: 04/30/2014  Procedure:   Colonoscopy  Indications:  Patient is a 58 year old Caucasian male who is heme-positive stools and history of colonic polyps.  Informed Consent:  The procedure and risks were reviewed with the patient and informed consent was obtained.  Medications:  Demerol 50 mg IV Versed 10 mg IV  Description of procedure:  After a digital rectal exam was performed, that colonoscope was advanced from the anus through the rectum and colon to the area of the cecum, ileocecal valve and appendiceal orifice. The cecum was deeply intubated. These structures were well-seen and photographed for the record. From the level of the cecum and ileocecal valve, the scope was slowly and cautiously withdrawn. The mucosal surfaces were carefully surveyed utilizing scope tip to flexion to facilitate fold flattening as needed. The scope was pulled down into the rectum where a thorough exam including retroflexion was performed.  Findings:   Prep excellent. Three small polyps ablated and submitted together. 2 are located at transverse colon and one at splenic flexure. One was cold snared and the others were biopsied. Another small polyp could not be found and available to be removed. Multiple diverticula at sigmoid colon. Normal rectal mucosa. Hemorrhoids below the dentate line.   Therapeutic/Diagnostic Maneuvers Performed:  See above  Complications:  None  Cecal Withdrawal Time:  22 minutes  Impression:  Examination performed to cecum. Multiple diverticula sigmoid colon. Three small polyps were ablated as above and submitted together. Fourth small polyp could not be found on the way out. External hemorrhoids.  Recommendations:  Standard instructions given. I will contact patient  with biopsy results and further recommendations.  Valeska Haislip U  04/30/2014 1:34 PM  CC: Dr. Hilma Favors, Betsy Coder, MD & Dr. Rayne Du ref. provider found

## 2014-04-30 NOTE — Discharge Instructions (Signed)
Resume usual medications and high fiber diet. No driving for 24 hours. Physician will call with biopsy results   High-Fiber Diet Fiber is found in fruits, vegetables, and grains. A high-fiber diet encourages the addition of more whole grains, legumes, fruits, and vegetables in your diet. The recommended amount of fiber for adult males is 38 g per day. For adult females, it is 25 g per day. Pregnant and lactating women should get 28 g of fiber per day. If you have a digestive or bowel problem, ask your caregiver for advice before adding high-fiber foods to your diet. Eat a variety of high-fiber foods instead of only a select few type of foods.  PURPOSE  To increase stool bulk.  To make bowel movements more regular to prevent constipation.  To lower cholesterol.  To prevent overeating. WHEN IS THIS DIET USED?  It may be used if you have constipation and hemorrhoids.  It may be used if you have uncomplicated diverticulosis (intestine condition) and irritable bowel syndrome.  It may be used if you need help with weight management.  It may be used if you want to add it to your diet as a protective measure against atherosclerosis, diabetes, and cancer. SOURCES OF FIBER  Whole-grain breads and cereals.  Fruits, such as apples, oranges, bananas, berries, prunes, and pears.  Vegetables, such as green peas, carrots, sweet potatoes, beets, broccoli, cabbage, spinach, and artichokes.  Legumes, such split peas, soy, lentils.  Almonds. FIBER CONTENT IN FOODS Starches and Grains / Dietary Fiber (g)  Cheerios, 1 cup / 3 g  Corn Flakes cereal, 1 cup / 0.7 g  Rice crispy treat cereal, 1 cup / 0.3 g  Instant oatmeal (cooked),  cup / 2 g  Frosted wheat cereal, 1 cup / 5.1 g  Brown, long-grain rice (cooked), 1 cup / 3.5 g  White, long-grain rice (cooked), 1 cup / 0.6 g  Enriched macaroni (cooked), 1 cup / 2.5 g Legumes / Dietary Fiber (g)  Baked beans (canned, plain, or  vegetarian),  cup / 5.2 g  Kidney beans (canned),  cup / 6.8 g  Pinto beans (cooked),  cup / 5.5 g Breads and Crackers / Dietary Fiber (g)  Plain or honey graham crackers, 2 squares / 0.7 g  Saltine crackers, 3 squares / 0.3 g  Plain, salted pretzels, 10 pieces / 1.8 g  Whole-wheat bread, 1 slice / 1.9 g  White bread, 1 slice / 0.7 g  Raisin bread, 1 slice / 1.2 g  Plain bagel, 3 oz / 2 g  Flour tortilla, 1 oz / 0.9 g  Corn tortilla, 1 small / 1.5 g  Hamburger or hotdog bun, 1 small / 0.9 g Fruits / Dietary Fiber (g)  Apple with skin, 1 medium / 4.4 g  Sweetened applesauce,  cup / 1.5 g  Banana,  medium / 1.5 g  Grapes, 10 grapes / 0.4 g  Orange, 1 small / 2.3 g  Raisin, 1.5 oz / 1.6 g  Melon, 1 cup / 1.4 g Vegetables / Dietary Fiber (g)  Green beans (canned),  cup / 1.3 g  Carrots (cooked),  cup / 2.3 g  Broccoli (cooked),  cup / 2.8 g  Peas (cooked),  cup / 4.4 g  Mashed potatoes,  cup / 1.6 g  Lettuce, 1 cup / 0.5 g  Corn (canned),  cup / 1.6 g  Tomato,  cup / 1.1 g Document Released: 07/23/2005 Document Revised: 01/22/2012 Document Reviewed: 10/25/2011 ExitCare Patient  Information 2015 [place], [place]. This information is not intended to replace advice given to you by your health care provider. Make sure you discuss any questions you have with your health care provider. Colonoscopy, Care After Refer to this sheet in the next few weeks. These instructions provide you with information on caring for yourself after your procedure. Your health care provider may also give you more specific instructions. Your treatment has been planned according to current medical practices, but problems sometimes occur. Call your health care provider if you have any problems or questions after your procedure. WHAT TO EXPECT AFTER THE PROCEDURE  After your procedure, it is typical to have the following:  A small amount of blood in your stool.  Moderate  amounts of gas and mild abdominal cramping or bloating. HOME CARE INSTRUCTIONS  Do not drive, operate machinery, or sign important documents for 24 hours.  You may shower and resume your regular physical activities, but move at a slower pace for the first 24 hours.  Take frequent rest periods for the first 24 hours.  Walk around or put a warm pack on your abdomen to help reduce abdominal cramping and bloating.  Drink enough fluids to keep your urine clear or pale yellow.  You may resume your normal diet as instructed by your health care provider. Avoid heavy or fried foods that are hard to digest.  Avoid drinking alcohol for 24 hours or as instructed by your health care provider.  Only take over-the-counter or prescription medicines as directed by your health care provider.  If a tissue sample (biopsy) was taken during your procedure:  Do not take aspirin or blood thinners for 7 days, or as instructed by your health care provider.  Do not drink alcohol for 7 days, or as instructed by your health care provider.  Eat soft foods for the first 24 hours. SEEK MEDICAL CARE IF: You have persistent spotting of blood in your stool 2-3 days after the procedure. SEEK IMMEDIATE MEDICAL CARE IF:  You have more than a small spotting of blood in your stool.  You pass large blood clots in your stool.  Your abdomen is swollen (distended).  You have nausea or vomiting.  You have a fever.  You have increasing abdominal pain that is not relieved with medicine. Document Released: 03/06/2004 Document Revised: 05/13/2013 Document Reviewed: 03/30/2013 [facility] Patient Information 2015 [place], [place]. This information is not intended to replace advice given to you by your health care provider. Make sure you discuss any questions you have with your health care provider.

## 2014-04-30 NOTE — H&P (Signed)
Ryan Hines is an 58 y.o. male.   Chief Complaint: Patient is here for colonoscopy. HPI: Patient is 58 year old Caucasian male who presents with heme-positive stool. He denies abdominal pain change in his bowel habits or frank bleeding. He has occasional hematochezia. He is on OTC NSAIDs and doesn't take any. He has history of colonic polyps. Polyps removed on colonoscopy for over 12 years ago. Last colonoscopy was in 2007 and no polyps were found. Family history is negative for CRC. He has history of Barrett's esophagus with dysplasia. He underwent RFA ablation at [facility] and remains free of Barrett's. He is getting yearly EGD at [facility].  Past Medical History  Diagnosis Date  . Barrett's esophagus   . Hyperlipidemia   . GERD (gastroesophageal reflux disease)   . Melanoma     face  . Skin cancer     facial-basal cell carcinoma    Past Surgical History  Procedure Laterality Date  . Esophagogastroduodenoscopy    . Skin cancer surgery on face      Family History  Problem Relation Age of Onset  . Colon cancer Neg Hx    Social History:  reports that he has quit smoking. His smoking use included Cigarettes. He has a 70 pack-year smoking history. He does not have any smokeless tobacco history on file. He reports that he does not drink alcohol or use illicit drugs.  Allergies: No Known Allergies  Medications Prior to Admission  Medication Sig Dispense Refill  . ALPRAZolam (XANAX) 1 MG tablet Take 1 mg by mouth at bedtime as needed.      Marland Kitchen aspirin 81 MG tablet Take 81 mg by mouth daily.      Marland Kitchen NEXIUM 40 MG capsule take 1 capsule by mouth once daily  30 capsule  11  . simvastatin (ZOCOR) 40 MG tablet Take 40 mg by mouth every evening.      . Tamsulosin HCl (FLOMAX) 0.4 MG CAPS Take 0.4 mg by mouth daily.         No results found for this or any previous visit (from the past 48 hour(s)). No results found.  ROS  Blood pressure 145/90, temperature 98.3 F (36.8  C), temperature source Oral, resp. rate 20, height 6' (1.829 m), weight 263 lb (119.296 kg), SpO2 94.00%. Physical Exam  Constitutional: He appears well-developed and well-nourished.  HENT:  Mouth/Throat: Oropharynx is clear and moist.  Linear scar below the left eye  Eyes: Conjunctivae are normal. No scleral icterus.  Neck: No thyromegaly present.  Left clavicular abnormality secondary to old fracture  Cardiovascular: Normal rate, regular rhythm and normal heart sounds.   No murmur heard. Respiratory: Effort normal and breath sounds normal.  GI: Soft. He exhibits no distension and no mass. There is no tenderness.  Musculoskeletal: He exhibits no edema.  Lymphadenopathy:    He has no cervical adenopathy.  Neurological: He is alert.  Skin: Skin is warm and dry.     Assessment/Plan Heme-positive stool. History of colonic polyps. Diagnostic colonoscopy.  REHMAN,NAJEEB U 04/30/2014, 12:46 PM

## 2014-05-03 ENCOUNTER — Encounter (HOSPITAL_COMMUNITY): Payer: Self-pay | Admitting: Internal Medicine

## 2014-05-24 ENCOUNTER — Ambulatory Visit (INDEPENDENT_AMBULATORY_CARE_PROVIDER_SITE_OTHER): Payer: 59 | Admitting: Internal Medicine

## 2014-05-27 ENCOUNTER — Other Ambulatory Visit: Payer: Self-pay | Admitting: Dermatology

## 2014-06-01 ENCOUNTER — Encounter (INDEPENDENT_AMBULATORY_CARE_PROVIDER_SITE_OTHER): Payer: Self-pay | Admitting: *Deleted

## 2014-08-03 ENCOUNTER — Encounter (INDEPENDENT_AMBULATORY_CARE_PROVIDER_SITE_OTHER): Payer: Self-pay

## 2014-08-23 ENCOUNTER — Other Ambulatory Visit (HOSPITAL_COMMUNITY): Payer: Self-pay | Admitting: Family Medicine

## 2014-08-23 ENCOUNTER — Ambulatory Visit (HOSPITAL_COMMUNITY)
Admission: RE | Admit: 2014-08-23 | Discharge: 2014-08-23 | Disposition: A | Discharge status: Home / Self Care | Payer: 59 | Source: Ambulatory Visit | Attending: Family Medicine | Admitting: Family Medicine

## 2014-08-23 DIAGNOSIS — Z6834 Body mass index (BMI) 34.0-34.9, adult: Secondary | ICD-10-CM | POA: Insufficient documentation

## 2014-08-23 DIAGNOSIS — Z87891 Personal history of nicotine dependence: Secondary | ICD-10-CM | POA: Diagnosis not present

## 2014-08-23 DIAGNOSIS — J209 Acute bronchitis, unspecified: Secondary | ICD-10-CM | POA: Insufficient documentation

## 2014-08-23 DIAGNOSIS — J984 Other disorders of lung: Secondary | ICD-10-CM | POA: Insufficient documentation

## 2014-08-23 DIAGNOSIS — R0989 Other specified symptoms and signs involving the circulatory and respiratory systems: Secondary | ICD-10-CM | POA: Insufficient documentation

## 2014-08-23 DIAGNOSIS — C3411 Malignant neoplasm of upper lobe, right bronchus or lung: Secondary | ICD-10-CM | POA: Insufficient documentation

## 2014-08-23 DIAGNOSIS — E6609 Other obesity due to excess calories: Secondary | ICD-10-CM | POA: Insufficient documentation

## 2014-08-23 DIAGNOSIS — R042 Hemoptysis: Secondary | ICD-10-CM | POA: Insufficient documentation

## 2014-08-23 DIAGNOSIS — R918 Other nonspecific abnormal finding of lung field: Secondary | ICD-10-CM

## 2014-08-23 DIAGNOSIS — J0100 Acute maxillary sinusitis, unspecified: Secondary | ICD-10-CM | POA: Insufficient documentation

## 2014-08-23 MED ORDER — IOHEXOL 300 MG/ML  SOLN
80.0000 mL | Freq: Once | INTRAMUSCULAR | Status: AC | PRN
  Administered 2014-08-23: 80 mL via INTRAVENOUS

## 2014-08-24 ENCOUNTER — Ambulatory Visit (HOSPITAL_COMMUNITY): Payer: 59

## 2014-08-25 ENCOUNTER — Other Ambulatory Visit (HOSPITAL_COMMUNITY): Payer: Self-pay | Admitting: Oncology

## 2014-08-25 ENCOUNTER — Ambulatory Visit (INDEPENDENT_AMBULATORY_CARE_PROVIDER_SITE_OTHER): Payer: 59 | Admitting: Cardiothoracic Surgery

## 2014-08-25 ENCOUNTER — Encounter: Payer: Self-pay | Admitting: *Deleted

## 2014-08-25 ENCOUNTER — Encounter: Payer: Self-pay | Admitting: Cardiothoracic Surgery

## 2014-08-25 VITALS — BP 124/87 | HR 62 | Resp 16 | Ht 72.0 in | Wt 264.0 lb

## 2014-08-25 DIAGNOSIS — R918 Other nonspecific abnormal finding of lung field: Secondary | ICD-10-CM | POA: Insufficient documentation

## 2014-08-25 DIAGNOSIS — R042 Hemoptysis: Secondary | ICD-10-CM

## 2014-08-25 NOTE — Progress Notes (Signed)
[address]       [place] [address]             [phone]                    [facility] Medical Record #[id number] Date of Birth: August 27, 1955  Referring: Pieter Partridge, MD Primary Care: Purvis Kilts, MD  Chief Complaint:    Chief Complaint  Patient presents with  . Lung Mass    right upper...CT CHEST @ [place]    History of Present Illness:    Ryan Hines 59 y.o. male is seen in the office  today for cough, hemptysis and abnormal CT of chest suspicious for lung carcinoma.   Patient has  very strong smoking history having quit smoking 3 years ago.  He  started feeling having low grade fever in December, was started on course of antibiotics. . Some of the symptoms came back with a intermittent cough productive of blood the past week. There was no associated yellow or green phlegm.  He has not lost weight. His appetite remains adequate. He does not have headaches nausea vomiting or change in bowel habits.  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    One of his brothers died of metastatic head and neck cancer in his late 45s. His other 2 brothers are alive in good health. His father died of heart attack but had extensive prostate cancer one time in his life. His father was 9 years old.  His mother is still living.    Current Activity/ Functional Status:  Patient is independent with mobility/ambulation, transfers, ADL's, IADL's.   Zubrod Score: At the time of surgery this patient's most appropriate activity status/level should be described as: [x]     0    Normal activity, no symptoms []     1    Restricted in physical strenuous activity but ambulatory, able to do out light work []     2    Ambulatory and capable of self care, unable to do work activities, up and about               >50 % of waking hours                               []     3    Only limited self care, in bed greater than 50% of waking hours []     4    Completely disabled, no self care, confined to bed or chair []     5    Moribund   Past Medical History  Diagnosis Date  . Barrett's esophagus   . Hyperlipidemia   . GERD (gastroesophageal reflux disease)   . Melanoma     face  . Skin cancer     facial-basal cell carcinoma    Past Surgical History  Procedure Laterality Date  . Esophagogastroduodenoscopy    . Skin cancer surgery on face    . Colonoscopy N/A 04/30/2014    Procedure: COLONOSCOPY;  Surgeon: Rogene Houston, MD;  Location: [facility];  Service: Endoscopy;  Laterality: N/A;  1200 - moved to 12:25 - Ann to notify pt   Patient has had endoscopic mucosa resection for Barett's by Dr Levada Schilling  Hill  Family History  Problem Relation Age of Onset  . Colon cancer Neg Hx     History   Social History  . Marital Status: Married    Spouse Name: N/A    Number of Children: N/A  . Years of Education: N/A   Occupational History   Works at [employer], no exposure known to asbestosis     Social History Main Topics  . Smoking status: Former Smoker -- 2.00 packs/day for 35 years    Types: Cigarettes  . Smokeless tobacco: Not on file     Comment: ver few.      History  Smoking status  . Former Smoker -- 2.00 packs/day for 35 years  . Types: Cigarettes  Smokeless tobacco  . Not on file    Comment: ver few.    History  Alcohol Use No     No Known Allergies  Current Outpatient Prescriptions  Medication Sig Dispense Refill  . ALPRAZolam (XANAX) 1 MG tablet Take 1 mg by mouth at bedtime as needed.    Marland Kitchen aspirin 81 MG tablet Take 81 mg by mouth daily.    . chlorpheniramine-HYDROcodone (TUSSIONEX) 10-8 MG/5ML LQCR Take 5 mLs by mouth every 12 (twelve) hours as needed for cough.    Marland Kitchen levofloxacin (LEVAQUIN) 500 MG tablet Take 500 mg by mouth daily. X 10 days...ending 09/02/14    . NEXIUM 40 MG capsule  take 1 capsule by mouth once daily 30 capsule 11  . simvastatin (ZOCOR) 40 MG tablet Take 40 mg by mouth every evening.    . Tamsulosin HCl (FLOMAX) 0.4 MG CAPS Take 0.4 mg by mouth daily.      No current facility-administered medications for this visit.     Review of Systems:     Cardiac Review of Systems: Y or N  Chest Pain [ n   ]  Resting SOB [n   ] Exertional SOB  [n  ]  Orthopnea Florencio.Farrier  ]   Pedal Edema [ n  ]    Palpitations [ n ] Syncope  [n  ]   Presyncope [ n  ]  General Review of Systems: [Y] = yes [  ]=no Constitional: recent weight change [n  ];  Wt loss over the last 3 months [   ] anorexia [  ]; fatigue [  ]; nausea [  ]; night sweats [  ]; fever [  ]; or chills [  ];          Dental: poor dentition[  ]; Last Dentist visit:   Eye : blurred vision [  ]; diplopia [   ]; vision changes [  ];  Amaurosis fugax[  ]; Resp: cough [ y ];  wheezing[y  ];  hemoptysis[y  ]; shortness of breath[ n ]; paroxysmal nocturnal dyspnea[n  ]; dyspnea on exertion[n ]; or orthopnea[n ];  GI:  gallstones[  ], vomiting[  ];  dysphagia[  ]; melena[  ];  hematochezia [  ]; heartburn[  ];   Hx of  Colonoscopy[ y ]; GU: kidney stones [  ]; hematuria[  ];   dysuria [  ];  nocturia[  ];  history of     obstruction [  ]; urinary frequency [  ]             Skin: rash, swelling[  ];, hair loss[  ];  peripheral edema[  ];  or itching[  ]; Musculosketetal: myalgias[  ];  joint swelling[  ];  joint erythema[  ];  joint pain[  ];  back pain[  ];  Heme/Lymph: bruising[  ];  bleeding[  ];  anemia[  ];  Neuro: TIA[  ];  headaches[  ];  stroke[  ];  vertigo[  ];  seizures[  ];   paresthesias[  ];  difficulty walking[n  ];  Psych:depression[  ]; anxiety[  ];  Endocrine: diabetes[  ];  thyroid dysfunction[  ];  Immunizations: Flu up to date [ n ]; Pneumococcal up to date [ n ];  Other:  Physical Exam: BP 124/87 mmHg  Pulse 62  Resp 16  Ht 6' (1.829 m)  Wt 264 lb (119.75 kg)  BMI 35.80 kg/m2  SpO2 96%  PHYSICAL  EXAMINATION:  General appearance: alert, cooperative, appears stated age and no distress Neurologic: intact Heart: regular rate and rhythm, S1, S2 normal, no murmur, click, rub or gallop Lungs: wheezes RML Abdomen: soft, non-tender; bowel sounds normal; no masses,  no organomegaly Extremities: extremities normal, atraumatic, no cyanosis or edema and Homans sign is negative, no sign of DVT no cervicasl or supraclavicular adenopathy   Diagnostic Studies & Laboratory data:     Recent Radiology Findings:   Dg Chest 2 View  08/23/2014   CLINICAL DATA:  59 year old male with 4 day history of cough, congestion and hemoptysis. Former smoker.  EXAM: CHEST  2 VIEW  COMPARISON:  Prior chest x-ray 01/10/2009  FINDINGS: Interval development of right hilar masslike fullness. On the lateral view, a rounded density in the right hilar/suprahilar region measures approximately 6 x 5.6 cm. There is wedge-shaped density peripheral leak concerning for postobstructive changes. The heart size is within normal limits. The remainder of the lungs are clear. No pleural effusion or pneumothorax. No acute osseous abnormality. Multi level degenerative spurring throughout the spine.  IMPRESSION: 1. Approximately 6 cm masslike density in the right hilar/suprahilar region with evidence of postobstructive change distally in the right upper lobe. Recommend further evaluation with CT scan of the chest with IV contrast to evaluate for primary bronchogenic carcinoma.   Electronically Signed   By: Jacqulynn Cadet M.D.   On: 08/23/2014 08:44   Ct Chest W Contrast  08/23/2014   CLINICAL DATA:  Lung mass.  Hemoptysis.  Cough and chest congestion.  EXAM: CT CHEST WITH CONTRAST  TECHNIQUE: Multidetector CT imaging of the chest was performed during intravenous contrast administration.  CONTRAST:  2mL OMNIPAQUE IOHEXOL 300 MG/ML  SOLN  COMPARISON:  Chest x-ray dated 08/23/2014  FINDINGS: There is a 7.1 x 5.6 x 4.1 cm mass in the right upper  lobe. The mass invades the right hilum completely obstructing the right upper lobe bronchus and immediately adjacent to the right middle lobe bronchus. The mass wraps around the right main pulmonary artery and extends into the mediastinum.  There is peripheral partial atelectasis of the right upper lobe. Accentuated interstitial markings probably represent lymphatic obstruction. The tumor extends along the minor fissure.  No worrisome mediastinal adenopathy.  The visualized portion of the upper abdomen is normal. No acute osseous abnormalities. No effusions.  Heart size is normal.  The left lung is clear.  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   Electronically Signed   By: Rozetta Nunnery M.D.   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 Patient has plans for further work up including MRI of brain and PET scan. I have recommended proceeding with broncoscopy, EBUS with biopsy and consider placement of porta cath. Will tentative plan for Friday Jan 22.       I spent 45 minutes counseling the patient face to face. The total time spent in the appointment was 80 minutes.  Grace Isaac MD      [address] [place] [address] Office [phone]   Beeper [phone]  08/25/2014 5:02 PM

## 2014-08-26 ENCOUNTER — Other Ambulatory Visit (HOSPITAL_COMMUNITY): Payer: 59

## 2014-08-26 ENCOUNTER — Encounter (HOSPITAL_COMMUNITY): Payer: Self-pay | Admitting: *Deleted

## 2014-08-26 ENCOUNTER — Other Ambulatory Visit: Payer: Self-pay | Admitting: *Deleted

## 2014-08-26 DIAGNOSIS — R918 Other nonspecific abnormal finding of lung field: Secondary | ICD-10-CM

## 2014-08-27 ENCOUNTER — Ambulatory Visit (HOSPITAL_COMMUNITY): Payer: 59 | Admitting: Anesthesiology

## 2014-08-27 ENCOUNTER — Encounter (HOSPITAL_COMMUNITY)
Admission: RE | Disposition: A | Discharge status: Home / Self Care | Payer: Self-pay | Source: Ambulatory Visit | Attending: Cardiothoracic Surgery

## 2014-08-27 ENCOUNTER — Ambulatory Visit (HOSPITAL_COMMUNITY): Payer: 59

## 2014-08-27 ENCOUNTER — Ambulatory Visit (HOSPITAL_COMMUNITY)
Admission: RE | Admit: 2014-08-27 | Discharge: 2014-08-27 | Disposition: A | Discharge status: Home / Self Care | Payer: 59 | Source: Ambulatory Visit | Attending: Cardiothoracic Surgery | Admitting: Cardiothoracic Surgery

## 2014-08-27 ENCOUNTER — Encounter (HOSPITAL_COMMUNITY): Payer: Self-pay | Admitting: *Deleted

## 2014-08-27 DIAGNOSIS — Z85820 Personal history of malignant melanoma of skin: Secondary | ICD-10-CM | POA: Insufficient documentation

## 2014-08-27 DIAGNOSIS — E669 Obesity, unspecified: Secondary | ICD-10-CM | POA: Insufficient documentation

## 2014-08-27 DIAGNOSIS — E785 Hyperlipidemia, unspecified: Secondary | ICD-10-CM | POA: Diagnosis not present

## 2014-08-27 DIAGNOSIS — Z419 Encounter for procedure for purposes other than remedying health state, unspecified: Secondary | ICD-10-CM

## 2014-08-27 DIAGNOSIS — R918 Other nonspecific abnormal finding of lung field: Secondary | ICD-10-CM

## 2014-08-27 DIAGNOSIS — Z87891 Personal history of nicotine dependence: Secondary | ICD-10-CM | POA: Insufficient documentation

## 2014-08-27 DIAGNOSIS — Z6835 Body mass index (BMI) 35.0-35.9, adult: Secondary | ICD-10-CM | POA: Diagnosis not present

## 2014-08-27 DIAGNOSIS — F419 Anxiety disorder, unspecified: Secondary | ICD-10-CM | POA: Diagnosis not present

## 2014-08-27 DIAGNOSIS — C3411 Malignant neoplasm of upper lobe, right bronchus or lung: Secondary | ICD-10-CM | POA: Diagnosis not present

## 2014-08-27 DIAGNOSIS — K219 Gastro-esophageal reflux disease without esophagitis: Secondary | ICD-10-CM | POA: Diagnosis not present

## 2014-08-27 DIAGNOSIS — Z95828 Presence of other vascular implants and grafts: Secondary | ICD-10-CM

## 2014-08-27 HISTORY — PX: VIDEO BRONCHOSCOPY WITH ENDOBRONCHIAL ULTRASOUND: SHX6177

## 2014-08-27 HISTORY — PX: PORTACATH PLACEMENT: SHX2246

## 2014-08-27 HISTORY — DX: Unspecified osteoarthritis, unspecified site: M19.90

## 2014-08-27 HISTORY — DX: Personal history of urinary calculi: Z87.442

## 2014-08-27 HISTORY — DX: Anxiety disorder, unspecified: F41.9

## 2014-08-27 HISTORY — PX: LUNG BIOPSY: SHX5088

## 2014-08-27 LAB — COMPREHENSIVE METABOLIC PANEL
ALBUMIN: 3.6 g/dL (ref 3.5–5.2)
ALT: 38 U/L (ref 0–53)
ANION GAP: 6 (ref 5–15)
AST: 25 U/L (ref 0–37)
Alkaline Phosphatase: 57 U/L (ref 39–117)
BILIRUBIN TOTAL: 0.5 mg/dL (ref 0.3–1.2)
BUN: 18 mg/dL (ref 6–23)
CO2: 32 mmol/L (ref 19–32)
CREATININE: 0.74 mg/dL (ref 0.50–1.35)
Calcium: 8.8 mg/dL (ref 8.4–10.5)
Chloride: 101 mEq/L (ref 96–112)
GLUCOSE: 119 mg/dL — AB (ref 70–99)
POTASSIUM: 4.1 mmol/L (ref 3.5–5.1)
Sodium: 139 mmol/L (ref 135–145)
Total Protein: 6.4 g/dL (ref 6.0–8.3)

## 2014-08-27 LAB — CBC
HEMATOCRIT: 37.1 % — AB (ref 39.0–52.0)
HEMOGLOBIN: 12.3 g/dL — AB (ref 13.0–17.0)
MCH: 29.5 pg (ref 26.0–34.0)
MCHC: 33.2 g/dL (ref 30.0–36.0)
MCV: 89 fL (ref 78.0–100.0)
PLATELETS: 215 10*3/uL (ref 150–400)
RBC: 4.17 MIL/uL — ABNORMAL LOW (ref 4.22–5.81)
RDW: 14.8 % (ref 11.5–15.5)
WBC: 7 10*3/uL (ref 4.0–10.5)

## 2014-08-27 LAB — APTT: APTT: 30 s (ref 24–37)

## 2014-08-27 LAB — PROTIME-INR
INR: 1.04 (ref 0.00–1.49)
Prothrombin Time: 13.7 seconds (ref 11.6–15.2)

## 2014-08-27 SURGERY — BRONCHOSCOPY, WITH EBUS
Anesthesia: General | Site: Chest | Laterality: Right

## 2014-08-27 MED ORDER — FENTANYL CITRATE 0.05 MG/ML IJ SOLN
INTRAMUSCULAR | Status: AC
  Filled 2014-08-27: qty 5

## 2014-08-27 MED ORDER — SUCCINYLCHOLINE CHLORIDE 20 MG/ML IJ SOLN
INTRAMUSCULAR | Status: DC | PRN
  Administered 2014-08-27: 120 mg via INTRAVENOUS

## 2014-08-27 MED ORDER — PROPOFOL 10 MG/ML IV BOLUS
INTRAVENOUS | Status: DC | PRN
  Administered 2014-08-27: 150 mg via INTRAVENOUS

## 2014-08-27 MED ORDER — PROMETHAZINE HCL 25 MG/ML IJ SOLN: 6.2500 mg | INTRAMUSCULAR | Status: DC | PRN

## 2014-08-27 MED ORDER — NEOSTIGMINE METHYLSULFATE 10 MG/10ML IV SOLN
INTRAVENOUS | Status: DC | PRN
  Administered 2014-08-27: 3 mg via INTRAVENOUS

## 2014-08-27 MED ORDER — HEPARIN SOD (PORK) LOCK FLUSH 100 UNIT/ML IV SOLN
INTRAVENOUS | Status: AC
  Filled 2014-08-27: qty 5

## 2014-08-27 MED ORDER — EPHEDRINE SULFATE 50 MG/ML IJ SOLN
INTRAMUSCULAR | Status: AC
  Filled 2014-08-27: qty 1

## 2014-08-27 MED ORDER — LACTATED RINGERS IV SOLN
INTRAVENOUS | Status: DC | PRN
  Administered 2014-08-27 (×2): via INTRAVENOUS

## 2014-08-27 MED ORDER — ONDANSETRON HCL 4 MG/2ML IJ SOLN
INTRAMUSCULAR | Status: AC
  Filled 2014-08-27: qty 2

## 2014-08-27 MED ORDER — GLYCOPYRROLATE 0.2 MG/ML IJ SOLN
INTRAMUSCULAR | Status: DC | PRN
  Administered 2014-08-27: 0.4 mg via INTRAVENOUS

## 2014-08-27 MED ORDER — SODIUM CHLORIDE 0.9 % IJ SOLN
INTRAMUSCULAR | Status: AC
  Filled 2014-08-27: qty 10

## 2014-08-27 MED ORDER — MEPERIDINE HCL 25 MG/ML IJ SOLN: 6.2500 mg | INTRAMUSCULAR | Status: DC | PRN

## 2014-08-27 MED ORDER — STERILE WATER FOR INJECTION IJ SOLN
INTRAMUSCULAR | Status: AC
  Filled 2014-08-27: qty 10

## 2014-08-27 MED ORDER — NEOSTIGMINE METHYLSULFATE 10 MG/10ML IV SOLN
INTRAVENOUS | Status: AC
  Filled 2014-08-27: qty 1

## 2014-08-27 MED ORDER — MIDAZOLAM HCL 2 MG/2ML IJ SOLN
INTRAMUSCULAR | Status: AC
  Filled 2014-08-27: qty 2

## 2014-08-27 MED ORDER — HYDROCODONE-ACETAMINOPHEN 5-325 MG PO TABS: 1.0000 | ORAL_TABLET | Freq: Four times a day (QID) | ORAL | Status: DC | PRN

## 2014-08-27 MED ORDER — ROCURONIUM BROMIDE 100 MG/10ML IV SOLN
INTRAVENOUS | Status: DC | PRN
  Administered 2014-08-27: 20 mg via INTRAVENOUS

## 2014-08-27 MED ORDER — FENTANYL CITRATE 0.05 MG/ML IJ SOLN
INTRAMUSCULAR | Status: DC | PRN
  Administered 2014-08-27 (×2): 50 ug via INTRAVENOUS
  Administered 2014-08-27: 100 ug via INTRAVENOUS
  Administered 2014-08-27: 50 ug via INTRAVENOUS

## 2014-08-27 MED ORDER — ARTIFICIAL TEARS OP OINT
TOPICAL_OINTMENT | OPHTHALMIC | Status: AC
  Filled 2014-08-27: qty 3.5

## 2014-08-27 MED ORDER — LIDOCAINE HCL (CARDIAC) 20 MG/ML IV SOLN
INTRAVENOUS | Status: DC | PRN
  Administered 2014-08-27: 50 mg via INTRAVENOUS

## 2014-08-27 MED ORDER — SODIUM CHLORIDE 0.9 % IR SOLN
Status: DC | PRN
  Administered 2014-08-27: 08:00:00

## 2014-08-27 MED ORDER — PROPOFOL 10 MG/ML IV BOLUS
INTRAVENOUS | Status: AC
  Filled 2014-08-27: qty 20

## 2014-08-27 MED ORDER — CEFAZOLIN SODIUM-DEXTROSE 2-3 GM-% IV SOLR
INTRAVENOUS | Status: DC | PRN
  Administered 2014-08-27: 2 g via INTRAVENOUS

## 2014-08-27 MED ORDER — PHENYLEPHRINE HCL 10 MG/ML IJ SOLN
INTRAMUSCULAR | Status: AC
  Filled 2014-08-27: qty 1

## 2014-08-27 MED ORDER — HEPARIN SOD (PORK) LOCK FLUSH 100 UNIT/ML IV SOLN
INTRAVENOUS | Status: DC | PRN
  Administered 2014-08-27: 500 [IU] via INTRAVENOUS

## 2014-08-27 MED ORDER — GLYCOPYRROLATE 0.2 MG/ML IJ SOLN
INTRAMUSCULAR | Status: AC
  Filled 2014-08-27: qty 3

## 2014-08-27 MED ORDER — CEFAZOLIN SODIUM-DEXTROSE 2-3 GM-% IV SOLR
INTRAVENOUS | Status: AC
  Filled 2014-08-27: qty 50

## 2014-08-27 MED ORDER — SODIUM CHLORIDE 0.9 % IR SOLN
Status: DC | PRN
  Administered 2014-08-27: 30 mL

## 2014-08-27 MED ORDER — SUCCINYLCHOLINE CHLORIDE 20 MG/ML IJ SOLN
INTRAMUSCULAR | Status: AC
  Filled 2014-08-27: qty 1

## 2014-08-27 MED ORDER — MIDAZOLAM HCL 5 MG/5ML IJ SOLN
INTRAMUSCULAR | Status: DC | PRN
  Administered 2014-08-27: 2 mg via INTRAVENOUS

## 2014-08-27 MED ORDER — ONDANSETRON HCL 4 MG/2ML IJ SOLN
INTRAMUSCULAR | Status: DC | PRN
  Administered 2014-08-27: 4 mg via INTRAVENOUS

## 2014-08-27 MED ORDER — EPINEPHRINE HCL 1 MG/ML IJ SOLN
INTRAMUSCULAR | Status: AC
  Filled 2014-08-27: qty 1

## 2014-08-27 MED ORDER — LIDOCAINE HCL (CARDIAC) 20 MG/ML IV SOLN
INTRAVENOUS | Status: AC
  Filled 2014-08-27: qty 5

## 2014-08-27 SURGICAL SUPPLY — 60 items
ADH SKN CLS APL DERMABOND .7 (GAUZE/BANDAGES/DRESSINGS) ×3
BAG DECANTER FOR FLEXI CONT (MISCELLANEOUS) ×4 IMPLANT
BLADE SURG 10 STRL SS (BLADE) ×1 IMPLANT
BLADE SURG 11 STRL SS (BLADE) ×5 IMPLANT
BRUSH CYTOL CELLEBRITY 1.5X140 (MISCELLANEOUS) ×1 IMPLANT
CANISTER SUCTION 2500CC (MISCELLANEOUS) ×8 IMPLANT
CONT SPEC 4OZ CLIKSEAL STRL BL (MISCELLANEOUS) ×4 IMPLANT
COVER PROBE W GEL 5X96 (DRAPES) ×4 IMPLANT
COVER SURGICAL LIGHT HANDLE (MISCELLANEOUS) ×4 IMPLANT
COVER TABLE BACK 60X90 (DRAPES) ×4 IMPLANT
DERMABOND ADVANCED (GAUZE/BANDAGES/DRESSINGS) ×1
DERMABOND ADVANCED .7 DNX12 (GAUZE/BANDAGES/DRESSINGS) ×3 IMPLANT
DRAPE C-ARM 42X72 X-RAY (DRAPES) ×5 IMPLANT
DRAPE CHEST BREAST 15X10 FENES (DRAPES) ×5 IMPLANT
ELECT CAUTERY BLADE 6.4 (BLADE) ×4 IMPLANT
ELECT REM PT RETURN 9FT ADLT (ELECTROSURGICAL) ×4
ELECTRODE REM PT RTRN 9FT ADLT (ELECTROSURGICAL) ×3 IMPLANT
FORCEPS BIOP RJ4 1.8 (CUTTING FORCEPS) ×1 IMPLANT
GAUZE SPONGE 4X4 12PLY STRL (GAUZE/BANDAGES/DRESSINGS) ×8 IMPLANT
GLOVE BIO SURGEON STRL SZ 6.5 (GLOVE) ×12 IMPLANT
GOWN STRL REUS W/ TWL LRG LVL3 (GOWN DISPOSABLE) ×6 IMPLANT
GOWN STRL REUS W/TWL LRG LVL3 (GOWN DISPOSABLE) ×8
GUIDEWIRE UNCOATED ST S 7038 (WIRE) IMPLANT
INTRODUCER 13FR (MISCELLANEOUS) IMPLANT
INTRODUCER COOK 11FR (CATHETERS) IMPLANT
KIT BASIN OR (CUSTOM PROCEDURE TRAY) ×4 IMPLANT
KIT CLEAN ENDO COMPLIANCE (KITS) ×8 IMPLANT
KIT PORT POWER 9.6FR MRI PREA (Catheter) IMPLANT
KIT PORT POWER ISP 8FR (Catheter) IMPLANT
KIT POWER CATH 8FR (Catheter) ×1 IMPLANT
KIT ROOM TURNOVER OR (KITS) ×8 IMPLANT
MARKER SKIN DUAL TIP RULER LAB (MISCELLANEOUS) ×4 IMPLANT
NDL BIOPSY TRANSBRONCH 21G (NEEDLE) IMPLANT
NDL BLUNT 18X1 FOR OR ONLY (NEEDLE) IMPLANT
NDL HYPO 25GX1X1/2 BEV (NEEDLE) ×3 IMPLANT
NEEDLE 22X1 1/2 (OR ONLY) (NEEDLE) IMPLANT
NEEDLE BIOPSY TRANSBRONCH 21G (NEEDLE) IMPLANT
NEEDLE BLUNT 18X1 FOR OR ONLY (NEEDLE) IMPLANT
NEEDLE HYPO 25GX1X1/2 BEV (NEEDLE) ×4 IMPLANT
NEEDLE SYS SONOTIP II EBUSTBNA (NEEDLE) ×4 IMPLANT
NS IRRIG 1000ML POUR BTL (IV SOLUTION) ×8 IMPLANT
OIL SILICONE PENTAX (PARTS (SERVICE/REPAIRS)) ×4 IMPLANT
PACK GENERAL/GYN (CUSTOM PROCEDURE TRAY) ×4 IMPLANT
PAD ARMBOARD 7.5X6 YLW CONV (MISCELLANEOUS) ×16 IMPLANT
SET SHEATH INTRODUCER 10FR (MISCELLANEOUS) IMPLANT
SUT ETHILON 3 0 FSL (SUTURE) ×1 IMPLANT
SUT SILK 2 0 SH (SUTURE) ×4 IMPLANT
SUT VIC AB 3-0 SH 18 (SUTURE) ×4 IMPLANT
SUT VIC AB 3-0 X1 27 (SUTURE) ×1 IMPLANT
SUT VICRYL 4-0 PS2 18IN ABS (SUTURE) ×4 IMPLANT
SYR 20CC LL (SYRINGE) ×8 IMPLANT
SYR 20ML ECCENTRIC (SYRINGE) ×4 IMPLANT
SYR 30ML SLIP (SYRINGE) ×2 IMPLANT
SYR 5ML LUER SLIP (SYRINGE) IMPLANT
SYR CONTROL 10ML LL (SYRINGE) ×4 IMPLANT
TOWEL OR 17X24 6PK STRL BLUE (TOWEL DISPOSABLE) ×8 IMPLANT
TOWEL OR 17X26 10 PK STRL BLUE (TOWEL DISPOSABLE) ×4 IMPLANT
TRAP SPECIMEN MUCOUS 40CC (MISCELLANEOUS) ×4 IMPLANT
TUBE CONNECTING 12X1/4 (SUCTIONS) ×8 IMPLANT
WATER STERILE IRR 1000ML POUR (IV SOLUTION) ×4 IMPLANT

## 2014-08-27 NOTE — Brief Op Note (Signed)
      [address]       [place] [address]             [phone]      08/27/2014  9:49 AM  PATIENT:  Ryan Hines  59 y.o. male  PRE-OPERATIVE DIAGNOSIS:  LUNG MASS  POST-OPERATIVE DIAGNOSIS:  LUNG MASS- final path pending  PROCEDURE:  Procedure(s) with comments: VIDEO BRONCHOSCOPY WITH ENDOBRONCHIAL ULTRASOUND (N/A) - Video Bronchoscopy with mediastinal node biopsy LUNG BIOPSY (N/A) - Video Bronchoscopy with Biopsy INSERTION PORT-A-CATH (Right) - Right Port-a-cath Insertion using ultrasound and fluoroscopy  SURGEON:  Surgeon(s) and Role:    * Grace Isaac, MD - Primary   ANESTHESIA:   general  EBL:  Total I/O In: 1200 [I.V.:1200] Out: -   BLOOD ADMINISTERED:none  DRAINS: none   LOCAL MEDICATIONS USED:  NONE  SPECIMEN:  Source of Specimen:  #7 nodes and right upper lobe bronchus  DISPOSITION OF SPECIMEN:  PATHOLOGY  COUNTS:  YES  DICTATION: .Dragon Dictation  PLAN OF CARE: Discharge to home after PACU  PATIENT DISPOSITION:  PACU - hemodynamically stable.   Delay start of Pharmacological VTE agent (>24hrs) due to surgical blood loss or risk of bleeding:yes   yes

## 2014-08-27 NOTE — Discharge Instructions (Signed)
What to eat:  For your first meals, you should eat lightly; only small meals initially.  If you do not have nausea, you may eat larger meals.  Avoid spicy, greasy and heavy food.    General Anesthesia, Adult, Care After  Refer to this sheet in the next few weeks. These instructions provide you with information on caring for yourself after your procedure. Your health care provider may also give you more specific instructions. Your treatment has been planned according to current medical practices, but problems sometimes occur. Call your health care provider if you have any problems or questions after your procedure.  WHAT TO EXPECT AFTER THE PROCEDURE  After the procedure, it is typical to experience:  Sleepiness.  Nausea and vomiting. HOME CARE INSTRUCTIONS  For the first 24 hours after general anesthesia:  Have a responsible person with you.  Do not drive a car. If you are alone, do not take public transportation.  Do not drink alcohol.  Do not take medicine that has not been prescribed by your health care provider.  Do not sign important papers or make important decisions.  You may resume a normal diet and activities as directed by your health care provider.  Change bandages (dressings) as directed.  If you have questions or problems that seem related to general anesthesia, call the hospital and ask for the anesthetist or anesthesiologist on call. SEEK MEDICAL CARE IF:  You have nausea and vomiting that continue the day after anesthesia.  You develop a rash. SEEK IMMEDIATE MEDICAL CARE IF:  You have difficulty breathing.  You have chest pain.  You have any allergic problems. Document Released: 10/29/2000 Document Revised: 03/25/2013 Document Reviewed: 02/05/2013  [facility] Patient Information 2014 [place], [place].   Flexible Bronchoscopy, Care After Refer to this sheet in the next few weeks. These instructions provide you with information on caring for yourself after your procedure.  Your health care provider may also give you more specific instructions. Your treatment has been planned according to current medical practices, but problems sometimes occur. Call your health care provider if you have any problems or questions after your procedure.  WHAT TO EXPECT AFTER THE PROCEDURE It is normal to have the following symptoms for 24-48 hours after the procedure:  Increased cough. Low-grade fever. Sore throat or hoarse voice. Small streaks of blood in your thick spit (sputum) if tissue samples were taken (biopsy). HOME CARE INSTRUCTIONS  Do not eat or drink anything for 2 hours after your procedure. Your nose and throat were numbed by medicine. If you try to eat or drink before the medicine wears off, food or drink could go into your lungs or you could burn yourself. After the numbness is gone and your cough and gag reflexes have returned, you may eat soft food and drink liquids slowly.  The day after the procedure, you can go back to your normal diet.  You may resume normal activities.  Keep all follow-up visits as directed by your health care provider. It is important to keep all your appointments, especially if tissue samples were taken for testing (biopsy). SEEK IMMEDIATE MEDICAL CARE IF:  You have increasing shortness of breath.  You become light-headed or faint.  You have chest pain.  You have any new concerning symptoms. You cough up more than a small amount of blood. The amount of blood you cough up increases. MAKE SURE YOU: Understand these instructions. Will watch your condition. Will get help right away if you are not doing  well or get worse. Document Released: 02/09/2005 Document Revised: 12/07/2013 Document Reviewed: 03/27/2013 [facility] Patient Information 2015 [place], [place]. This information is not intended to replace advice given to you by your health care provider. Make sure you discuss any questions you have with your health care provider.  Implanted  [facility] Guide An implanted port is a type of central line that is placed under the skin. Central lines are used to provide IV access when treatment or nutrition needs to be given through a person's veins. Implanted ports are used for long-term IV access. An implanted port may be placed because:   You need IV medicine that would be irritating to the small veins in your hands or arms.   You need long-term IV medicines, such as antibiotics.   You need IV nutrition for a long period.   You need frequent blood draws for lab tests.   You need dialysis.  Implanted ports are usually placed in the chest area, but they can also be placed in the upper arm, the abdomen, or the leg. An implanted port has two main parts:   Reservoir. The reservoir is round and will appear as a small, raised area under your skin. The reservoir is the part where a needle is inserted to give medicines or draw blood.   Catheter. The catheter is a thin, flexible tube that extends from the reservoir. The catheter is placed into a large vein. Medicine that is inserted into the reservoir goes into the catheter and then into the vein.  HOW WILL I CARE FOR MY INCISION SITE? Do not get the incision site wet. Bathe or shower as directed by your health care provider.  HOW IS MY PORT ACCESSED? Special steps must be taken to access the port:   Before the port is accessed, a numbing cream can be placed on the skin. This helps numb the skin over the port site.   Your health care provider uses a sterile technique to access the port.  Your health care provider must put on a mask and sterile gloves.  The skin over your port is cleaned carefully with an antiseptic and allowed to dry.  The port is gently pinched between sterile gloves, and a needle is inserted into the port.  Only "non-coring" port needles should be used to access the port. Once the port is accessed, a blood return should be checked. This helps ensure that the  port is in the vein and is not clogged.   If your port needs to remain accessed for a constant infusion, a clear (transparent) bandage will be placed over the needle site. The bandage and needle will need to be changed every week, or as directed by your health care provider.   Keep the bandage covering the needle clean and dry. Do not get it wet. Follow your health care provider's instructions on how to take a shower or bath while the port is accessed.   If your port does not need to stay accessed, no bandage is needed over the port.  WHAT IS FLUSHING? Flushing helps keep the port from getting clogged. Follow your health care provider's instructions on how and when to flush the port. Ports are usually flushed with saline solution or a medicine called heparin. The need for flushing will depend on how the port is used.   If the port is used for intermittent medicines or blood draws, the port will need to be flushed:   After medicines have been given.  After blood has been drawn.   As part of routine maintenance.   If a constant infusion is running, the port may not need to be flushed.  HOW LONG WILL MY PORT STAY IMPLANTED? The port can stay in for as long as your health care provider thinks it is needed. When it is time for the port to come out, surgery will be done to remove it. The procedure is similar to the one performed when the port was put in.  WHEN SHOULD I SEEK IMMEDIATE MEDICAL CARE? When you have an implanted port, you should seek immediate medical care if:   You notice a bad smell coming from the incision site.   You have swelling, redness, or drainage at the incision site.   You have more swelling or pain at the port site or the surrounding area.   You have a fever that is not controlled with medicine. Document Released: 07/23/2005 Document Revised: 05/13/2013 Document Reviewed: 03/30/2013 [facility] Patient Information 2015 [place], [place]. This information is  not intended to replace advice given to you by your health care provider. Make sure you discuss any questions you have with your health care provider. Implanted Port Insertion, Care After Refer to this sheet in the next few weeks. These instructions provide you with information on caring for yourself after your procedure. Your health care provider may also give you more specific instructions. Your treatment has been planned according to current medical practices, but problems sometimes occur. Call your health care provider if you have any problems or questions after your procedure. WHAT TO EXPECT AFTER THE PROCEDURE After your procedure, it is typical to have the following:   Discomfort at the port insertion site. Ice packs to the area will help.  Bruising on the skin over the port. This will subside in 3-4 days. HOME CARE INSTRUCTIONS  After your port is placed, you will get a manufacturer's information card. The card has information about your port. Keep this card with you at all times.   Know what kind of port you have. There are many types of ports available.   Wear a medical alert bracelet in case of an emergency. This can help alert health care workers that you have a port.   The port can stay in for as long as your health care provider believes it is necessary.   A home health care nurse may give medicines and take care of the port.   You or a family member can get special training and directions for giving medicine and taking care of the port at home.  SEEK MEDICAL CARE IF:   Your port does not flush or you are unable to get a blood return.   You have a fever or chills. SEEK IMMEDIATE MEDICAL CARE IF:  You have new fluid or pus coming from your incision.   You notice a bad smell coming from your incision site.   You have swelling, pain, or more redness at the incision or port site.   You have chest pain or shortness of breath. Document Released: 05/13/2013  Document Revised: 07/28/2013 Document Reviewed: 05/13/2013 [facility] Patient Information 2015 [place], [place]. This information is not intended to replace advice given to you by your health care provider. Make sure you discuss any questions you have with your health care provider.

## 2014-08-27 NOTE — H&P (Signed)
[address]       [place] [address]             [phone]                    [name] [place] Medical Record #[id number] Date of Birth: [birth year]  Referring: Pieter Partridge, MD Primary Care: Purvis Kilts, MD  Chief Complaint:    Cough and lung mass   History of Present Illness:    [facility] 59 y.o. male is seen in the office  today for cough, hemptysis and abnormal CT of chest suspicious for lung carcinoma.   Patient has  very strong smoking history having quit smoking 3 years ago.  He  started feeling having low grade fever in December, was started on course of antibiotics. . Some of the symptoms came back with a intermittent cough productive of blood the past week. There was no associated yellow or green phlegm.  He has not lost weight. His appetite remains adequate. He does not have headaches nausea vomiting or change in bowel habits.  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    One of his brothers died of metastatic head and neck cancer in his late 85s. His other 2 brothers are alive in good health. His father died of heart attack but had extensive prostate cancer one time in his life. His father was 63 years old.  His mother is still living.    Current Activity/ Functional Status:  Patient is independent with mobility/ambulation, transfers, ADL's, IADL's.   Zubrod Score: At the time of surgery this patient's most appropriate activity status/level should be described as: [x]     0    Normal activity, no symptoms []     1    Restricted in physical strenuous activity but ambulatory, able to do out light work []     2    Ambulatory and capable of self care, unable to do work activities, up and about               >50 % of waking hours                              []     3    Only limited self care, in bed greater  than 50% of waking hours []     4    Completely disabled, no self care, confined to bed or chair []     5    Moribund   Past Medical History  Diagnosis Date  . Barrett's esophagus   . Hyperlipidemia   . GERD (gastroesophageal reflux disease)   . Melanoma     face  . Skin cancer     facial-basal cell carcinoma  . Anxiety   . History of kidney stones   . Arthritis     Past Surgical History  Procedure Laterality Date  . Esophagogastroduodenoscopy    . Skin cancer surgery on face    . Colonoscopy N/A 04/30/2014    Procedure: COLONOSCOPY;  Surgeon: Rogene Houston, MD;  Location: [facility];  Service: Endoscopy;  Laterality: N/A;  1200 - moved to 12:25 - Ann to notify pt  . Ankle surgery Right     Bone spur  . Kidney stent  Right     Kindey Stones   Patient has had endoscopic mucosa resection for Barett's by Dr Gabriel Earing  Family History  Problem Relation Age of Onset  . Colon cancer Neg Hx     History   Social History  . Marital Status: Married    Spouse Name: N/A    Number of Children: N/A  . Years of Education: N/A   Occupational History   Works at [employer], no exposure known to asbestosis     Social History Main Topics  . Smoking status: Former Smoker -- 2.00 packs/day for 35 years    Types: Cigarettes  . Smokeless tobacco: Not on file     Comment: ver few.      History  Smoking status  . Former Smoker -- 2.00 packs/day for 35 years  . Types: Cigarettes  Smokeless tobacco  . Never Used    Comment: 08/26/14- quit 3 years ago    History  Alcohol Use No     No Known Allergies  No current facility-administered medications for this encounter.     Review of Systems:     Cardiac Review of Systems: Y or N  Chest Pain [ n   ]  Resting SOB [n   ] Exertional SOB  [n  ]  Orthopnea [facility]  ]   Pedal Edema [ n  ]    Palpitations [ n ] Syncope  [n  ]   Presyncope [ n  ]  General Review of Systems: [Y] = yes [  ]=no Constitional: recent weight  change [n  ];  Wt loss over the last 3 months [   ] anorexia [  ]; fatigue [  ]; nausea [  ]; night sweats [  ]; fever [  ]; or chills [  ];          Dental: poor dentition[  ]; Last Dentist visit:   Eye : blurred vision [  ]; diplopia [   ]; vision changes [  ];  Amaurosis fugax[  ]; Resp: cough [ y ];  wheezing[y  ];  hemoptysis[y  ]; shortness of breath[ n ]; paroxysmal nocturnal dyspnea[n  ]; dyspnea on exertion[n ]; or orthopnea[n ];  GI:  gallstones[  ], vomiting[  ];  dysphagia[  ]; melena[  ];  hematochezia [  ]; heartburn[  ];   Hx of  Colonoscopy[ y ]; GU: kidney stones [  ]; hematuria[  ];   dysuria [  ];  nocturia[  ];  history of     obstruction [  ]; urinary frequency [  ]             Skin: rash, swelling[  ];, hair loss[  ];  peripheral edema[  ];  or itching[  ]; Musculosketetal: myalgias[  ];  joint swelling[  ];  joint erythema[  ];  joint pain[  ];  back pain[  ];  Heme/Lymph: bruising[  ];  bleeding[  ];  anemia[  ];  Neuro: TIA[  ];  headaches[  ];  stroke[  ];  vertigo[  ];  seizures[  ];   paresthesias[  ];  difficulty walking[n  ];  Psych:depression[  ]; anxiety[  ];  Endocrine: diabetes[  ];  thyroid dysfunction[  ];  Immunizations: Flu up to date [ n ]; Pneumococcal up to date [ n ];  Other:  Physical Exam: BP 149/85 mmHg  Pulse 54  Temp(Src) 97.9 F (36.6  C) (Oral)  Resp 18  Wt 264 lb (119.75 kg)  SpO2 95%  PHYSICAL EXAMINATION:  General appearance: alert, cooperative, appears stated age and no distress Neurologic: intact Heart: regular rate and rhythm, S1, S2 normal, no murmur, click, rub or gallop Lungs: wheezes RML Abdomen: soft, non-tender; bowel sounds normal; no masses,  no organomegaly Extremities: extremities normal, atraumatic, no cyanosis or edema and Homans sign is negative, no sign of DVT no cervicasl or supraclavicular adenopathy   Diagnostic Studies & Laboratory data:     Recent Radiology Findings:   Dg Chest 2 View  08/23/2014    CLINICAL DATA:  59 year old male with 4 day history of cough, congestion and hemoptysis. Former smoker.  EXAM: CHEST  2 VIEW  COMPARISON:  Prior chest x-ray 01/10/2009  FINDINGS: Interval development of right hilar masslike fullness. On the lateral view, a rounded density in the right hilar/suprahilar region measures approximately 6 x 5.6 cm. There is wedge-shaped density peripheral leak concerning for postobstructive changes. The heart size is within normal limits. The remainder of the lungs are clear. No pleural effusion or pneumothorax. No acute osseous abnormality. Multi level degenerative spurring throughout the spine.  IMPRESSION: 1. Approximately 6 cm masslike density in the right hilar/suprahilar region with evidence of postobstructive change distally in the right upper lobe. Recommend further evaluation with CT scan of the chest with IV contrast to evaluate for primary bronchogenic carcinoma.   Electronically Signed   By: Jacqulynn Cadet M.D.   On: 08/23/2014 08:44   Ct Chest W Contrast  08/23/2014   CLINICAL DATA:  Lung mass.  Hemoptysis.  Cough and chest congestion.  EXAM: CT CHEST WITH CONTRAST  TECHNIQUE: Multidetector CT imaging of the chest was performed during intravenous contrast administration.  CONTRAST:  45mL OMNIPAQUE IOHEXOL 300 MG/ML  SOLN  COMPARISON:  Chest x-ray dated 08/23/2014  FINDINGS: There is a 7.1 x 5.6 x 4.1 cm mass in the right upper lobe. The mass invades the right hilum completely obstructing the right upper lobe bronchus and immediately adjacent to the right middle lobe bronchus. The mass wraps around the right main pulmonary artery and extends into the mediastinum.  There is peripheral partial atelectasis of the right upper lobe. Accentuated interstitial markings probably represent lymphatic obstruction. The tumor extends along the minor fissure.  No worrisome mediastinal adenopathy.  The visualized portion of the upper abdomen is normal. No acute osseous abnormalities. No  effusions.  Heart size is normal.  The left lung is clear.  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   Electronically Signed   By: Rozetta Nunnery M.D.   On: 08/23/2014 16:02    PET scan done at [facility] last night: IMPRESSION:  Hypermetabolic right hilar mass causing occlusion of the right upper lobe bronchus with associated postobstructive changes. Single mildly hypermetabolic right paratracheal lymph node.   Result Narrative  INDICATION: R91.8: Other nonspecific abnormal finding of lung field C43.30: Malignant melanoma of unspecified part of face  TECHNIQUE: 98.3 millicuries of J-82 FDG was administered intravenously. PET imaging was obtained from the skull vertex through both feet. CT images were obtained for attenuation correction and localization purposes. Glucose level was 104 MG/DL. Time from  injection to scan was 64 minutes.  Comparison none.  FINDINGS:  Hypermetabolic right hilar mass measuring 4.4 x 6.37 m with a max SUV of 17.2. Associated occlusion of the right  upper lobe bronchus with postobstructive consolidation in the upper lobe. Mildly hypermetabolic right paratracheal lymph node with max SUV of 3.4 measuring 2.0 cm  Remaining activity throughout the body is normal and physiologic without additional hypermetabolic foci.  Additional findings: Nonobstructing left renal stone. Multiple left renal cysts.      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 Patient has plans for further work up including MRI of brain . I have recommended proceeding with broncoscopy, EBUS with biopsy and  placement of porta cath.   The  goals risks and alternatives of the planned surgical procedure broncoscopy, EBUS with biopsy and  placement of porta cath  have been discussed with the patient in detail. The risks of the procedure including death, infection, stroke, myocardial infarction, bleeding, blood transfusion,  PTX have all been discussed specifically.  I have quoted Ryan [facility] a 2 % of perioperative mortality and a complication rate as high as 10%. The patient's questions have been answered.Ryan Hines is willing  to proceed with the planned procedure.    Grace Isaac MD      [place] [address],[place] [address] Office [phone]   Beeper [phone]  08/27/2014 7:10 AM

## 2014-08-27 NOTE — Anesthesia Preprocedure Evaluation (Addendum)
Anesthesia Evaluation  Patient identified by MRN, date of birth, ID band Patient awake    Reviewed: Allergy & Precautions, NPO status , Patient's Chart, lab work & pertinent test results  Airway Mallampati: II  TM Distance: >3 FB Neck ROM: Full    Dental no notable dental hx. (+) Teeth Intact, Dental Advisory Given   Pulmonary neg pulmonary ROS, former smoker,  breath sounds clear to auscultation  Pulmonary exam normal       Cardiovascular negative cardio ROS  Rhythm:Regular Rate:Normal     Neuro/Psych PSYCHIATRIC DISORDERS Anxiety negative neurological ROS     GI/Hepatic Neg liver ROS, GERD-  ,  Endo/Other  negative endocrine ROS  Renal/GU negative Renal ROS     Musculoskeletal  (+) Arthritis -,   Abdominal (+) + obese,   Peds  Hematology negative hematology ROS (+)   Anesthesia Other Findings   Reproductive/Obstetrics negative OB ROS                            Anesthesia Physical Anesthesia Plan  ASA: III  Anesthesia Plan: General   Post-op Pain Management:    Induction: Intravenous  Airway Management Planned: Oral ETT  Additional Equipment: None  Intra-op Plan:   Post-operative Plan: Extubation in OR  Informed Consent: I have reviewed the patients History and Physical, chart, labs and discussed the procedure including the risks, benefits and alternatives for the proposed anesthesia with the patient or authorized representative who has indicated his/her understanding and acceptance.   Dental advisory given  Plan Discussed with: CRNA, Anesthesiologist and Surgeon  Anesthesia Plan Comments:        Anesthesia Quick Evaluation

## 2014-08-27 NOTE — Anesthesia Procedure Notes (Signed)
Procedure Name: Intubation Date/Time: 08/27/2014 8:05 AM Performed by: Maude Leriche D Pre-anesthesia Checklist: Patient identified, Emergency Drugs available, Suction available, Patient being monitored and Timeout performed Patient Re-evaluated:Patient Re-evaluated prior to inductionOxygen Delivery Method: Circle system utilized Preoxygenation: Pre-oxygenation with 100% oxygen Intubation Type: IV induction Ventilation: Mask ventilation without difficulty and Oral airway inserted - appropriate to patient size Laryngoscope Size: Miller and 2 Grade View: Grade I Tube type: Oral Tube size: 8.5 mm Number of attempts: 1 Airway Equipment and Method: Stylet Placement Confirmation: ETT inserted through vocal cords under direct vision and breath sounds checked- equal and bilateral Secured at: 23 cm Tube secured with: Tape Dental Injury: Teeth and Oropharynx as per pre-operative assessment

## 2014-08-27 NOTE — Anesthesia Postprocedure Evaluation (Signed)
Anesthesia Post Note  Patient: Ryan [facility]  Procedure(s) Performed: Procedure(s) (LRB): VIDEO BRONCHOSCOPY WITH ENDOBRONCHIAL ULTRASOUND (N/A) LUNG BIOPSY (N/A) INSERTION PORT-A-CATH (Right)  Anesthesia type: General  Patient location: PACU  Post pain: Pain level controlled  Post assessment: Post-op Vital signs reviewed  Last Vitals: BP 139/83 mmHg  Pulse 71  Temp(Src) 36.5 C (Oral)  Resp 15  Wt 264 lb (119.75 kg)  SpO2 94%  Post vital signs: Reviewed  Level of consciousness: sedated  Complications: No apparent anesthesia complications

## 2014-08-27 NOTE — Transfer of Care (Signed)
Immediate Anesthesia Transfer of Care Note  Patient: Ryan Hines  Procedure(s) Performed: Procedure(s) with comments: VIDEO BRONCHOSCOPY WITH ENDOBRONCHIAL ULTRASOUND (N/A) - Video Bronchoscopy with mediastinal node biopsy LUNG BIOPSY (N/A) - Video Bronchoscopy with Biopsy INSERTION PORT-A-CATH (Right) - Right Port-a-cath Insertion using ultrasound and fluoroscopy  Patient Location: PACU  Anesthesia Type:General  Level of Consciousness: awake  Airway & Oxygen Therapy: Patient Spontanous Breathing and Patient connected to face mask oxygen  Post-op Assessment: Report given to PACU RN and Post -op Vital signs reviewed and stable  Post vital signs: Reviewed and stable  Complications: No apparent anesthesia complications

## 2014-08-30 ENCOUNTER — Encounter (HOSPITAL_COMMUNITY): Payer: Self-pay | Admitting: Cardiothoracic Surgery

## 2014-08-30 NOTE — Progress Notes (Signed)
Patient not at home

## 2014-08-31 NOTE — Op Note (Signed)
Ryan Hines, PETION NO.:  [id number]  MEDICAL RECORD NO.:  [id number]  LOCATION:  [facility]                         FACILITY:  [facility]  PHYSICIAN:  Lanelle Bal, MD    DATE OF BIRTH:  04/07/1956  DATE OF PROCEDURE:  08/27/2014 DATE OF DISCHARGE:  08/27/2014                              OPERATIVE REPORT   PREOPERATIVE DIAGNOSIS:  Right lung mass with mediastinal involvement.  POSTOPERATIVE DIAGNOSIS:  Right lung mass with mediastinal involvement. Final pathology pending.  SURGICAL PROCEDURES:  Video bronchoscopy with biopsy of right upper lobe lung mass, EBUS with transbronchial biopsy of #7 lymph nodes, placement of right subclavian vein Port-A-Cath with fluoroscopy and vascular ultrasound.  SURGEON:  Lanelle Bal, MD  BRIEF HISTORY:  The patient is a 59 year old male who presented with recent onset of hemoptysis.  The patient was evaluated by Dr. Tressie Stalker. 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  The patient was referred for biopsy and consideration of placement of Port-A-Cath, bronchoscopy with biopsy and EBUS, and placement of Port-A-Cath was discussed in detail with the patient who was agreeable and signed informed consent.  DESCRIPTION OF PROCEDURE:  The patient underwent general endotracheal anesthesia without incident.  Appropriate timeout was performed and we proceeded initially with video bronchoscopy through the endotracheal tube.  The patient had slight blood-tinged sputum in the tracheobronchial tree.  There was a mass partially obstructing the right upper lobe bronchus at least three-quarters of the diameter extending into the trachea.  It did not obstruct the bronchus intermedius.  The left tracheobronchial tree was without endobronchial lesions.  The scope was then removed and EBUS scope was placed.  The mediastinal lymph nodes at the carina were looked with EBUS scope into  the right mainstem bronchus.  The #7 lymph nodes were identified with ultrasound and multiple passes of transbronchial biopsy with aspiration were done. Initial cytology on these lymph nodes did not reveal any malignancy.  We then went back to the visual scope.  Brushings of the right upper lobe bronchus were obtained and biopsies of the mass to decrease any bleeding.  Dilute solution of epinephrine and saline was flushed through the scope.  The patient tolerated the procedure and what appeared to be adequate biopsies had been obtained.  We then removed the scope and reprepped the patient's chest with Betadine and draped in sterile manner.  The patient had had a previous clavicular fracture with deformity of the left clavicle, so we decided to proceed with placement of Port-A-Cath on the right side.  SonoSite ultrasound was used to locate the subclavian vein.  A 16-gauge needle was used to introduce a guidewire into the subclavian vein and the guidewire was positioned under fluoroscopic guidance into the superior vena cava.  The guidewire was passed down into the inferior vena cava confirming that it was in the SVC.  A subcutaneous pocket was created and a 9.5-French preattached purple Port-A-Cath was irrigated.  The reservoir was placed into the pocket and the catheter was tunneled to the insertion site of the guidewire.  Over the guidewire and under fluoroscopy, a dilator with peel-away sheath was positioned.  Through the peel-away sheath, appropriately trimmed Port-A-Cath catheter was passed into the superior vena cava and appeared positioned well.  There was good blood return. The catheter was flushed with heparinized saline and instilled with concentrated solution of heparin.  The incisions were then closed with interrupted 3-0 Vicryl and running 4-0 subcuticular stitch.  Dermabond was applied.  Sponge and needle count was correct.  The patient tolerated the procedure without  obvious complication.  He was extubated in the operating room and transferred to the recovery room for further postoperative care.     Lanelle Bal, MD     EG/MEDQ  D:  08/31/2014  T:  08/31/2014  Job:  [id number]

## 2014-09-03 ENCOUNTER — Ambulatory Visit (HOSPITAL_COMMUNITY): Payer: 59

## 2014-09-05 ENCOUNTER — Other Ambulatory Visit (INDEPENDENT_AMBULATORY_CARE_PROVIDER_SITE_OTHER): Payer: Self-pay | Admitting: Internal Medicine

## 2015-01-10 ENCOUNTER — Other Ambulatory Visit (HOSPITAL_COMMUNITY): Payer: Self-pay | Admitting: Oncology

## 2015-01-10 DIAGNOSIS — C3490 Malignant neoplasm of unspecified part of unspecified bronchus or lung: Secondary | ICD-10-CM

## 2015-03-22 ENCOUNTER — Other Ambulatory Visit (HOSPITAL_COMMUNITY): Payer: 59

## 2015-03-23 ENCOUNTER — Other Ambulatory Visit (HOSPITAL_COMMUNITY)
Admission: RE | Admit: 2015-03-23 | Discharge: 2015-03-23 | Disposition: A | Discharge status: Home / Self Care | Payer: 59 | Source: Ambulatory Visit | Attending: Oncology | Admitting: Oncology

## 2015-03-23 DIAGNOSIS — C4490 Unspecified malignant neoplasm of skin, unspecified: Secondary | ICD-10-CM | POA: Diagnosis present

## 2015-06-21 ENCOUNTER — Encounter (HOSPITAL_COMMUNITY): Payer: Self-pay

## 2015-07-07 ENCOUNTER — Ambulatory Visit
Admission: RE | Admit: 2015-07-07 | Discharge: 2015-07-07 | Disposition: A | Discharge status: Home / Self Care | Payer: 59 | Source: Ambulatory Visit | Attending: Radiation Oncology | Admitting: Radiation Oncology

## 2015-07-07 DIAGNOSIS — C3412 Malignant neoplasm of upper lobe, left bronchus or lung: Secondary | ICD-10-CM | POA: Insufficient documentation

## 2015-07-07 DIAGNOSIS — Z510 Encounter for antineoplastic radiation therapy: Secondary | ICD-10-CM | POA: Insufficient documentation

## 2015-07-07 NOTE — Progress Notes (Signed)
  Radiation Oncology         [phone] ________________________________  Name: Ryan Hines, Ryan Hines MRN: [id number]   Date: 12/1//2016  DOB:[birth year]  STEREOTACTIC BODY RADIOTHERAPY SIMULATION AND TREATMENT PLANNING NOTE    DIAGNOSIS:  Clinical stage I lung cancer presenting in the left upper lobe  NARRATIVE:  The patient was brought to the [place] suite.  Identity was confirmed.  All relevant records and images related to the planned course of therapy were reviewed.  The patient freely provided informed written consent to proceed with treatment after reviewing the details related to the planned course of therapy. The consent form was witnessed and verified by the simulation staff.  Then, the patient was set-up in a stable reproducible  supine position for radiation therapy.  A BodyFix immobilization pillow was fabricated for reproducible positioning.  Then I personally applied the abdominal compression paddle to limit respiratory excursion.  4D respiratoy motion management CT images were obtained.  Surface markings were placed.  The CT images were loaded into the planning software.  Then, using Cine, MIP, and standard views, the internal target volume (ITV) and planning target volumes (PTV) were delinieated, and avoidance structures were contoured.  Treatment planning then occurred.  The radiation prescription was entered and confirmed.  A total of two complex treatment devices were fabricated in the form of the BodyFix immobilization pillow and a neck accuform cushion.  I have requested : 3D Simulation  I have requested a DVH of the following structures: Heart, Lungs, Esophagus, Chest Wall, Brachial Plexus, Major Blood Vessels, and targets.  PLAN:  The patient will receive 54 Gy in 3 fractions using SBRT techniques.  -----------------------------------  Blair Promise, PhD, MD

## 2015-07-13 DIAGNOSIS — Z510 Encounter for antineoplastic radiation therapy: Secondary | ICD-10-CM | POA: Diagnosis not present

## 2015-07-19 ENCOUNTER — Encounter: Payer: Self-pay | Admitting: Radiation Oncology

## 2015-07-19 ENCOUNTER — Ambulatory Visit
Admission: RE | Admit: 2015-07-19 | Discharge: 2015-07-19 | Disposition: A | Discharge status: Home / Self Care | Payer: 59 | Source: Ambulatory Visit | Attending: Radiation Oncology | Admitting: Radiation Oncology

## 2015-07-19 VITALS — BP 116/82 | HR 84 | Temp 98.4°F | Resp 16 | Ht 72.0 in | Wt 264.4 lb

## 2015-07-19 DIAGNOSIS — C3412 Malignant neoplasm of upper lobe, left bronchus or lung: Secondary | ICD-10-CM

## 2015-07-19 DIAGNOSIS — Z510 Encounter for antineoplastic radiation therapy: Secondary | ICD-10-CM | POA: Diagnosis not present

## 2015-07-19 NOTE — Progress Notes (Signed)
  Radiation Oncology         [phone] ________________________________  Name: Ryan Hines MRN: [id number]  Date: 07/19/2015  DOB: Dec 10, 1955  Weekly Radiation Therapy Management    ICD-9-CM ICD-10-CM   1. Cancer of upper lobe of left lung (HCC) 162.3 C34.12      Current Dose: 18 Gy     Planned Dose:  54 Gy  Narrative . . . . . . . . The patient presents for routine under treatment assessment.                                   The patient is without complaint. He tolerated first SBRT well                                 Set-up films were reviewed.                                 The chart was checked. Physical Findings. . .  height is 6' (1.829 m) and weight is 264 lb 6.4 oz (119.931 kg). His oral temperature is 98.4 F (36.9 C). His blood pressure is 116/82 and his pulse is 84. His respiration is 16 and oxygen saturation is 98%. . The lungs are clear. The heart has a regular rhythm and rate. Impression . . . . . . . The patient is tolerating radiation. Plan . . . . . . . . . . . . Continue treatment as planned.  ________________________________   Blair Promise, PhD, MD

## 2015-07-19 NOTE — Progress Notes (Signed)
  Radiation Oncology         [phone] ________________________________  Name: Ryan Hines MRN: [id number]  Date: 07/19/2015  DOB: 09/21/1955  Stereotactic Body Radiotherapy Treatment Procedure Note  18 Gy of planned 54  Gy  NARRATIVE:  Ryan Hines was brought to the stereotactic radiation treatment machine and placed supine on the CT couch. The patient was set up for stereotactic body radiotherapy on the body fix pillow.  3D TREATMENT PLANNING AND DOSIMETRY:  The patient's radiation plan was reviewed and approved prior to starting treatment.  It showed 3-dimensional radiation distributions overlaid onto the planning CT.  The [facility] for the target structures as well as the organs at risk were reviewed. The documentation of this is filed in the radiation oncology EMR.  SIMULATION VERIFICATION:  The patient underwent CT imaging on the treatment unit.  These were carefully aligned to document that the ablative radiation dose would cover the target volume and maximally spare the nearby organs at risk according to the planned distribution.  SPECIAL TREATMENT PROCEDURE: Ryan Hines received high dose ablative stereotactic body radiotherapy to the planned target volume without unforeseen complications. Treatment was delivered uneventfully. The high doses associated with stereotactic body radiotherapy and the significant potential risks require careful treatment set up and patient monitoring constituting a special treatment procedure   STEREOTACTIC TREATMENT MANAGEMENT:  Following delivery, the patient was evaluated clinically. The patient tolerated treatment without significant acute effects, and was discharged to home in stable condition.    PLAN: Continue treatment as planned.  ________________________________  Blair Promise, PhD, MD

## 2015-07-19 NOTE — Progress Notes (Signed)
Ryan Hines has completed 1 fraction to his left lung.  He denies pain.  He reports an occasional dry cough.  He reports having shortness of breath with activity.  BP 116/82 mmHg  Pulse 84  Temp(Src) 98.4 F (36.9 C) (Oral)  Resp 16  Ht 6' (1.829 m)  Wt 264 lb 6.4 oz (119.931 kg)  BMI 35.85 kg/m2  SpO2 98%

## 2015-07-20 ENCOUNTER — Encounter: Payer: Self-pay | Admitting: Radiation Oncology

## 2015-07-21 ENCOUNTER — Ambulatory Visit
Admission: RE | Admit: 2015-07-21 | Discharge: 2015-07-21 | Disposition: A | Discharge status: Home / Self Care | Payer: 59 | Source: Ambulatory Visit | Attending: Radiation Oncology | Admitting: Radiation Oncology

## 2015-07-21 DIAGNOSIS — C3412 Malignant neoplasm of upper lobe, left bronchus or lung: Secondary | ICD-10-CM

## 2015-07-21 DIAGNOSIS — Z510 Encounter for antineoplastic radiation therapy: Secondary | ICD-10-CM | POA: Diagnosis not present

## 2015-07-21 NOTE — Progress Notes (Signed)
  Radiation Oncology         [phone] ________________________________  Name: Ryan Hines MRN: [id number]  Date: 07/21/2015  DOB: 23-May-1956  Stereotactic Body Radiotherapy Treatment Procedure Note  36 Gy, 54 Gy planned  NARRATIVE:  Ryan Hines was brought to the stereotactic radiation treatment machine and placed supine on the CT couch. The patient was set up for stereotactic body radiotherapy on the body fix pillow.  3D TREATMENT PLANNING AND DOSIMETRY:  The patient's radiation plan was reviewed and approved prior to starting treatment.  It showed 3-dimensional radiation distributions overlaid onto the planning CT.  The [facility] for the target structures as well as the organs at risk were reviewed. The documentation of this is filed in the radiation oncology EMR.  SIMULATION VERIFICATION:  The patient underwent CT imaging on the treatment unit.  These were carefully aligned to document that the ablative radiation dose would cover the target volume and maximally spare the nearby organs at risk according to the planned distribution.  SPECIAL TREATMENT PROCEDURE: Ryan Hines received high dose ablative stereotactic body radiotherapy to the planned target volume without unforeseen complications. Treatment was delivered uneventfully. The high doses associated with stereotactic body radiotherapy and the significant potential risks require careful treatment set up and patient monitoring constituting a special treatment procedure   STEREOTACTIC TREATMENT MANAGEMENT:  Following delivery, the patient was evaluated clinically. The patient tolerated treatment without significant acute effects, and was discharged to home in stable condition.    PLAN: Continue treatment as planned.  ________________________________  Blair Promise, PhD, MD

## 2015-07-26 ENCOUNTER — Ambulatory Visit
Admission: RE | Admit: 2015-07-26 | Discharge: 2015-07-26 | Disposition: A | Discharge status: Home / Self Care | Payer: 59 | Source: Ambulatory Visit | Attending: Radiation Oncology | Admitting: Radiation Oncology

## 2015-07-26 DIAGNOSIS — C3412 Malignant neoplasm of upper lobe, left bronchus or lung: Secondary | ICD-10-CM

## 2015-07-26 DIAGNOSIS — Z510 Encounter for antineoplastic radiation therapy: Secondary | ICD-10-CM | POA: Diagnosis not present

## 2015-07-26 NOTE — Progress Notes (Signed)
  Radiation Oncology         (336) [phone] ________________________________  Name: Ryan Hines MRN: [id number]  Date: 07/26/2015  DOB: 03/14/1956  Stereotactic Body Radiotherapy Treatment Procedure Note  Current dose 54 gray      planned dose of 54 gray  NARRATIVE:  Ryan Hines was brought to the stereotactic radiation treatment machine and placed supine on the CT couch. The patient was set up for stereotactic body radiotherapy on the body fix pillow.  3D TREATMENT PLANNING AND DOSIMETRY:  The patient's radiation plan was reviewed and approved prior to starting treatment.  It showed 3-dimensional radiation distributions overlaid onto the planning CT.  The [facility] for the target structures as well as the organs at risk were reviewed. The documentation of this is filed in the radiation oncology EMR.  SIMULATION VERIFICATION:  The patient underwent CT imaging on the treatment unit.  These were carefully aligned to document that the ablative radiation dose would cover the target volume and maximally spare the nearby organs at risk according to the planned distribution.  SPECIAL TREATMENT PROCEDURE: Ryan Hines received high dose ablative stereotactic body radiotherapy to the planned target volume without unforeseen complications. Treatment was delivered uneventfully. The high doses associated with stereotactic body radiotherapy and the significant potential risks require careful treatment set up and patient monitoring constituting a special treatment procedure   STEREOTACTIC TREATMENT MANAGEMENT:  Following delivery, the patient was evaluated clinically. The patient tolerated treatment without significant acute effects, and was discharged to home in stable condition.    PLAN: Continue treatment as planned.  ________________________________  Blair Promise, PhD, MD

## 2015-08-15 ENCOUNTER — Encounter: Payer: Self-pay | Admitting: Radiation Oncology

## 2015-08-15 NOTE — Progress Notes (Signed)
  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   No appreciable side effects during the course of his treatment  Plan: The patient has completed radiation treatment. The patient will return to radiation oncology clinic for routine followup in one month. I advised them to call or return sooner if they have any questions or concerns related to their recovery or treatment. He will follow-up at the [facility] in [place].  -----------------------------------  Blair Promise, PhD, MD

## 2015-08-24 ENCOUNTER — Other Ambulatory Visit (INDEPENDENT_AMBULATORY_CARE_PROVIDER_SITE_OTHER): Payer: Self-pay | Admitting: Internal Medicine

## 2015-11-11 ENCOUNTER — Other Ambulatory Visit: Payer: Self-pay

## 2016-01-05 ENCOUNTER — Other Ambulatory Visit: Payer: Self-pay | Admitting: Dermatology

## 2016-03-07 ENCOUNTER — Other Ambulatory Visit (INDEPENDENT_AMBULATORY_CARE_PROVIDER_SITE_OTHER): Payer: Self-pay | Admitting: Internal Medicine

## 2016-05-23 ENCOUNTER — Other Ambulatory Visit: Payer: Self-pay | Admitting: Dermatology

## 2016-08-27 ENCOUNTER — Other Ambulatory Visit (INDEPENDENT_AMBULATORY_CARE_PROVIDER_SITE_OTHER): Payer: Self-pay | Admitting: Internal Medicine

## 2017-02-12 ENCOUNTER — Other Ambulatory Visit (INDEPENDENT_AMBULATORY_CARE_PROVIDER_SITE_OTHER): Payer: Self-pay | Admitting: Internal Medicine

## 2017-06-11 ENCOUNTER — Ambulatory Visit (HOSPITAL_COMMUNITY)
Admission: RE | Admit: 2017-06-11 | Discharge: 2017-06-11 | Disposition: A | Discharge status: Home / Self Care | Payer: 59 | Source: Ambulatory Visit | Attending: Physician Assistant | Admitting: Physician Assistant

## 2017-06-11 ENCOUNTER — Other Ambulatory Visit (HOSPITAL_COMMUNITY): Payer: Self-pay | Admitting: Physician Assistant

## 2017-06-11 DIAGNOSIS — R590 Localized enlarged lymph nodes: Secondary | ICD-10-CM | POA: Diagnosis not present

## 2017-06-11 DIAGNOSIS — C3490 Malignant neoplasm of unspecified part of unspecified bronchus or lung: Secondary | ICD-10-CM

## 2017-06-11 DIAGNOSIS — R079 Chest pain, unspecified: Secondary | ICD-10-CM

## 2017-06-11 DIAGNOSIS — Z923 Personal history of irradiation: Secondary | ICD-10-CM | POA: Diagnosis not present

## 2017-06-11 DIAGNOSIS — R0602 Shortness of breath: Secondary | ICD-10-CM

## 2017-06-11 LAB — POCT I-STAT CREATININE: CREATININE: 0.7 mg/dL (ref 0.61–1.24)

## 2017-06-11 MED ORDER — IOPAMIDOL (ISOVUE-300) INJECTION 61%
75.0000 mL | Freq: Once | INTRAVENOUS | Status: AC | PRN
  Administered 2017-06-11: 75 mL via INTRAVENOUS

## 2017-09-16 ENCOUNTER — Emergency Department (HOSPITAL_COMMUNITY): Payer: 59

## 2017-09-16 ENCOUNTER — Other Ambulatory Visit: Payer: Self-pay

## 2017-09-16 ENCOUNTER — Emergency Department (HOSPITAL_COMMUNITY)
Admission: EM | Admit: 2017-09-16 | Discharge: 2017-09-16 | Disposition: A | Discharge status: Home / Self Care | Payer: 59 | Attending: Emergency Medicine | Admitting: Emergency Medicine

## 2017-09-16 ENCOUNTER — Encounter (HOSPITAL_COMMUNITY): Payer: Self-pay | Admitting: Emergency Medicine

## 2017-09-16 DIAGNOSIS — Z87891 Personal history of nicotine dependence: Secondary | ICD-10-CM | POA: Diagnosis not present

## 2017-09-16 DIAGNOSIS — M899 Disorder of bone, unspecified: Secondary | ICD-10-CM | POA: Diagnosis not present

## 2017-09-16 DIAGNOSIS — Z79899 Other long term (current) drug therapy: Secondary | ICD-10-CM | POA: Diagnosis not present

## 2017-09-16 DIAGNOSIS — Z85118 Personal history of other malignant neoplasm of bronchus and lung: Secondary | ICD-10-CM | POA: Diagnosis not present

## 2017-09-16 DIAGNOSIS — Z7984 Long term (current) use of oral hypoglycemic drugs: Secondary | ICD-10-CM | POA: Insufficient documentation

## 2017-09-16 DIAGNOSIS — Z85828 Personal history of other malignant neoplasm of skin: Secondary | ICD-10-CM | POA: Diagnosis not present

## 2017-09-16 DIAGNOSIS — M79662 Pain in left lower leg: Secondary | ICD-10-CM | POA: Diagnosis present

## 2017-09-16 DIAGNOSIS — Z7902 Long term (current) use of antithrombotics/antiplatelets: Secondary | ICD-10-CM | POA: Insufficient documentation

## 2017-09-16 HISTORY — DX: Malignant neoplasm of unspecified part of unspecified bronchus or lung: C34.90

## 2017-09-16 MED ORDER — HYDROCODONE-ACETAMINOPHEN 5-325 MG PO TABS: 2.0000 | ORAL_TABLET | ORAL | 0 refills | Status: AC | PRN

## 2017-09-16 MED ORDER — HYDROCODONE-ACETAMINOPHEN 5-325 MG PO TABS
2.0000 | ORAL_TABLET | Freq: Once | ORAL | Status: AC
  Administered 2017-09-16: 2 via ORAL
  Filled 2017-09-16: qty 2

## 2017-09-16 NOTE — ED Notes (Signed)
ED Provider at bedside. 

## 2017-09-16 NOTE — ED Triage Notes (Addendum)
Pt reports left foot pain since exercising x6 weeks ago. Pt reports was seen by Dr. Martinique and xrayed left foot. Pt reports if no improvement in pain was told to come back in 2-3 weeks. Pt reports pain has increased and cant tolerate it anymore. Pt able to bear weight. Ortho boot noted on LLE in triage.   Pt reports undergoing chemo treatments, last treatment on Tuesday.
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
## 2017-09-16 NOTE — Discharge Instructions (Signed)
Your xray shows that you likely have a cancer spread to your bone in your leg Call your cancer doctor today to share this result You should be seen in next week for follow up Hydrocodone for severe pain You may use the boot and crutches as needed ER for acute severe pain and swelling (may fracture)

## 2017-11-26 ENCOUNTER — Other Ambulatory Visit (INDEPENDENT_AMBULATORY_CARE_PROVIDER_SITE_OTHER): Payer: Self-pay | Admitting: Internal Medicine

## 2018-02-17 ENCOUNTER — Emergency Department (HOSPITAL_COMMUNITY): Payer: 59

## 2018-02-17 ENCOUNTER — Encounter (HOSPITAL_COMMUNITY): Payer: Self-pay

## 2018-02-17 ENCOUNTER — Other Ambulatory Visit: Payer: Self-pay

## 2018-02-17 ENCOUNTER — Emergency Department (HOSPITAL_COMMUNITY)
Admission: EM | Admit: 2018-02-17 | Discharge: 2018-02-17 | Disposition: A | Discharge status: Home / Self Care | Payer: 59 | Attending: Emergency Medicine | Admitting: Emergency Medicine

## 2018-02-17 DIAGNOSIS — K449 Diaphragmatic hernia without obstruction or gangrene: Secondary | ICD-10-CM | POA: Insufficient documentation

## 2018-02-17 DIAGNOSIS — R0602 Shortness of breath: Secondary | ICD-10-CM | POA: Diagnosis present

## 2018-02-17 DIAGNOSIS — R599 Enlarged lymph nodes, unspecified: Secondary | ICD-10-CM | POA: Insufficient documentation

## 2018-02-17 DIAGNOSIS — Z87891 Personal history of nicotine dependence: Secondary | ICD-10-CM | POA: Diagnosis not present

## 2018-02-17 DIAGNOSIS — C7802 Secondary malignant neoplasm of left lung: Secondary | ICD-10-CM | POA: Insufficient documentation

## 2018-02-17 DIAGNOSIS — R2242 Localized swelling, mass and lump, left lower limb: Secondary | ICD-10-CM | POA: Diagnosis not present

## 2018-02-17 DIAGNOSIS — Z9221 Personal history of antineoplastic chemotherapy: Secondary | ICD-10-CM | POA: Diagnosis not present

## 2018-02-17 DIAGNOSIS — J40 Bronchitis, not specified as acute or chronic: Secondary | ICD-10-CM

## 2018-02-17 DIAGNOSIS — Z85828 Personal history of other malignant neoplasm of skin: Secondary | ICD-10-CM | POA: Diagnosis not present

## 2018-02-17 LAB — CBC WITH DIFFERENTIAL/PLATELET
BASOS ABS: 0 10*3/uL (ref 0.0–0.1)
Basophils Relative: 0 %
EOS ABS: 0.4 10*3/uL (ref 0.0–0.7)
EOS PCT: 4 %
HCT: 43.4 % (ref 39.0–52.0)
HEMOGLOBIN: 14.3 g/dL (ref 13.0–17.0)
Lymphocytes Relative: 9 %
Lymphs Abs: 0.9 10*3/uL (ref 0.7–4.0)
MCH: 27.4 pg (ref 26.0–34.0)
MCHC: 32.9 g/dL (ref 30.0–36.0)
MCV: 83.3 fL (ref 78.0–100.0)
Monocytes Absolute: 0.7 10*3/uL (ref 0.1–1.0)
Monocytes Relative: 7 %
NEUTROS PCT: 80 %
Neutro Abs: 7.5 10*3/uL (ref 1.7–7.7)
PLATELETS: 322 10*3/uL (ref 150–400)
RBC: 5.21 MIL/uL (ref 4.22–5.81)
RDW: 16.7 % — ABNORMAL HIGH (ref 11.5–15.5)
WBC: 9.5 10*3/uL (ref 4.0–10.5)

## 2018-02-17 LAB — URINALYSIS, ROUTINE W REFLEX MICROSCOPIC
BACTERIA UA: NONE SEEN
Bilirubin Urine: NEGATIVE
GLUCOSE, UA: NEGATIVE mg/dL
Ketones, ur: NEGATIVE mg/dL
Leukocytes, UA: NEGATIVE
Nitrite: NEGATIVE
PROTEIN: NEGATIVE mg/dL
pH: 7 (ref 5.0–8.0)

## 2018-02-17 LAB — LACTIC ACID, PLASMA
LACTIC ACID, VENOUS: 2.1 mmol/L — AB (ref 0.5–1.9)
Lactic Acid, Venous: 1.4 mmol/L (ref 0.5–1.9)

## 2018-02-17 LAB — COMPREHENSIVE METABOLIC PANEL
ALBUMIN: 3.8 g/dL (ref 3.5–5.0)
ALK PHOS: 72 U/L (ref 38–126)
ALT: 12 U/L (ref 0–44)
AST: 12 U/L — AB (ref 15–41)
Anion gap: 8 (ref 5–15)
BUN: 13 mg/dL (ref 8–23)
CALCIUM: 12 mg/dL — AB (ref 8.9–10.3)
CO2: 30 mmol/L (ref 22–32)
CREATININE: 0.7 mg/dL (ref 0.61–1.24)
Chloride: 102 mmol/L (ref 98–111)
GFR calc Af Amer: >60 mL/min (ref >60)
GFR calc non Af Amer: >60 mL/min (ref >60)
GLUCOSE: 125 mg/dL — AB (ref 70–99)
Potassium: 3.3 mmol/L — ABNORMAL LOW (ref 3.5–5.1)
SODIUM: 140 mmol/L (ref 135–145)
Total Bilirubin: 0.7 mg/dL (ref 0.3–1.2)
Total Protein: 7.4 g/dL (ref 6.5–8.1)

## 2018-02-17 LAB — TROPONIN I: Troponin I: <0.03 ng/mL (ref <0.03)

## 2018-02-17 LAB — BRAIN NATRIURETIC PEPTIDE: B Natriuretic Peptide: 80 pg/mL (ref 0.0–100.0)

## 2018-02-17 MED ORDER — DOXYCYCLINE HYCLATE 100 MG PO TABS
100.0000 mg | ORAL_TABLET | Freq: Once | ORAL | Status: AC
  Administered 2018-02-17: 100 mg via ORAL
  Filled 2018-02-17: qty 1

## 2018-02-17 MED ORDER — ONDANSETRON 8 MG PO TBDP
8.0000 mg | ORAL_TABLET | Freq: Once | ORAL | Status: AC
  Administered 2018-02-17: 8 mg via ORAL
  Filled 2018-02-17: qty 1

## 2018-02-17 MED ORDER — ONDANSETRON HCL 4 MG/2ML IJ SOLN: 4.0000 mg | Freq: Once | INTRAMUSCULAR | Status: DC

## 2018-02-17 MED ORDER — DOXYCYCLINE HYCLATE 100 MG PO CAPS: 100.0000 mg | ORAL_CAPSULE | Freq: Two times a day (BID) | ORAL | 0 refills | Status: AC

## 2018-02-17 MED ORDER — IPRATROPIUM-ALBUTEROL 0.5-2.5 (3) MG/3ML IN SOLN
3.0000 mL | Freq: Once | RESPIRATORY_TRACT | Status: AC
  Administered 2018-02-17: 3 mL via RESPIRATORY_TRACT
  Filled 2018-02-17: qty 3

## 2018-02-17 MED ORDER — IOPAMIDOL (ISOVUE-370) INJECTION 76%
150.0000 mL | Freq: Once | INTRAVENOUS | Status: AC | PRN
  Administered 2018-02-17: 100 mL via INTRAVENOUS

## 2018-02-17 MED ORDER — PREDNISONE 20 MG PO TABS: ORAL_TABLET | ORAL | 0 refills | Status: AC

## 2018-02-17 MED ORDER — PREDNISONE 50 MG PO TABS
60.0000 mg | ORAL_TABLET | Freq: Once | ORAL | Status: AC
  Administered 2018-02-17: 60 mg via ORAL
  Filled 2018-02-17: qty 1

## 2018-02-17 MED ORDER — IPRATROPIUM-ALBUTEROL 0.5-2.5 (3) MG/3ML IN SOLN
3.0000 mL | RESPIRATORY_TRACT | Status: DC
  Administered 2018-02-17: 3 mL via RESPIRATORY_TRACT
  Filled 2018-02-17: qty 3

## 2018-02-17 MED ORDER — LACTATED RINGERS IV BOLUS
1000.0000 mL | Freq: Once | INTRAVENOUS | Status: AC
  Administered 2018-02-17: 1000 mL via INTRAVENOUS

## 2018-02-17 NOTE — ED Notes (Signed)
Patient ambulated around the nurses station. Oxygen stayed around 96-100% while ambulating on room air. Heart rate was 108bpm after ambulating.

## 2018-02-17 NOTE — ED Notes (Signed)
Date and time results received: 02/17/18 1603  Test: Lactic Acid Critical Value: 2.1  Name of Provider Notified: Dr. Dayna Barker  Orders Received? Or Actions Taken?: See chart

## 2018-02-17 NOTE — ED Provider Notes (Signed)
Emergency Department Provider Note   I have reviewed the triage vital signs and the nursing notes.   HISTORY  Chief Complaint Shortness of Breath   HPI Ryan Hines is a 62 y.o. male with multiple medical problems as documented below but most concerning for metastatic lung cancer currently on chemotherapy the presents the emergency department today with worsening shortness of breath.  Is difficult to get a good timeline was been going on and it sounds like his been short of breath for quite a while however the last few weeks and specifically last few days he has had a market increase in his dyspnea and is worse with exertion.  Is still there at rest.  During this whole time he has had blood-streaked sputum versus frank hemoptysis.  He was actually treated with Levaquin for a cough a few weeks ago by his oncology team.  Seem to help some but then got worse again so the patient try some Claritin which helped minimally but over the last few days has been progressively worsening.  He does have a history of having what sounds like a pericardial effusion.  On review of his records it seems that his perihilar mass on the left has been enlarging at least since November of this year (although the family thinks that it is decreasing in size) and is abutting the left pulmonary artery. He also states he has some type of swelling on his left proximal thigh which he is getting it surgically removed soon.  He is actually been on a break from his chemotherapy for the last week secondary to this removal and the fact he was on vacation and the chemotherapy makes him tired. No other associated or modifying symptoms.    Past Medical History:  Diagnosis Date  . Anxiety   . Arthritis   . Barrett's esophagus   . GERD (gastroesophageal reflux disease)   . History of kidney stones   . Hyperlipidemia   . Melanoma ([place])    face  . Metastatic lung cancer (metastasis from lung to other site) ([place])    2017  .  Skin cancer    facial-basal cell carcinoma    Patient Active Problem List   Diagnosis Date Noted  . Cancer of upper lobe of left lung ([place]) 07/07/2015  . Lung mass 08/25/2014  . Rectal bleeding 04/09/2014  . Flank pain 03/10/2013  . Melanoma of skin, site unspecified 03/10/2013  . Skin cancer 03/10/2013  . Barrett esophagus 01/28/2012  . Hyperlipidemia 01/28/2012    Past Surgical History:  Procedure Laterality Date  . ANKLE SURGERY Right    Bone spur  . COLONOSCOPY N/A 04/30/2014   Procedure: COLONOSCOPY;  Surgeon: Rogene Houston, MD;  [facility];  Service: Endoscopy;  Laterality: N/A;  1200 - moved to 12:25 - Ann to notify pt  . ESOPHAGOGASTRODUODENOSCOPY    . Kidney stent Right    [place] Stones  . LUNG BIOPSY N/A 08/27/2014   Procedure: LUNG BIOPSY;  Surgeon: Grace Isaac, MD;  Location: [facility];  Service: Thoracic;  Laterality: N/A;  Video Bronchoscopy with Biopsy  . PORTACATH PLACEMENT Right 08/27/2014   Procedure: INSERTION PORT-A-CATH;  Surgeon: Grace Isaac, MD;  Location: [facility];  Service: Thoracic;  Laterality: Right;  Right Port-a-cath Insertion using ultrasound and fluoroscopy  . Skin cancer surgery on face    . VIDEO BRONCHOSCOPY WITH ENDOBRONCHIAL ULTRASOUND N/A 08/27/2014   Procedure: VIDEO BRONCHOSCOPY WITH ENDOBRONCHIAL ULTRASOUND;  Surgeon: Percell Miller  Maryruth Bun, MD;  Location: [facility];  Service: Thoracic;  Laterality: N/A;  Video Bronchoscopy with mediastinal node biopsy    Current Outpatient Rx  . Order #: [id number] Class: Historical Med  . Order #: [id number] Class: Historical Med  . Order #: [id number] Class: Historical Med  . Order #: [id number] Class: Normal  . Order #: [id number] Class: Historical Med  . Order #: [id number] Class: Print  . Order #: [id number] Class: Historical Med  . Order #: [id number] Class: Historical Med  . Order #: [id number] Class: Historical Med  . Order #: [id number] Class: Historical Med  . Order #: [id number] Class: Historical Med    . Order #: [id number] Class: Historical Med  . Order #: [id number] Class: Historical Med  . Order #: [id number] Class: Historical Med  . Order #: [id number] Class: Historical Med  . Order #: [id number] Class: Historical Med  . Order #: [id number] Class: Print  . Order #: [id number] Class: Print    Allergies Patient has no known allergies.  Family History  Problem Relation Age of Onset  . Colon cancer Neg Hx     Social History Social History   Tobacco Use  . Smoking status: Former Smoker    Packs/day: 2.00    Years: 35.00    Pack years: 70.00    Types: Cigarettes  . Smokeless tobacco: Never Used  . Tobacco comment: 08/26/14- quit 3 years ago  Substance Use Topics  . Alcohol use: No  . Drug use: No    Review of Systems  All other systems negative except as documented in the HPI. All pertinent positives and negatives as reviewed in the HPI. ____________________________________________   PHYSICAL EXAM:  VITAL SIGNS: ED Triage Vitals  Enc Vitals Group     BP 02/17/18 1140 126/82     Pulse Rate 02/17/18 1140 (!) 104     Resp 02/17/18 1140 (!) 24     Temp 02/17/18 1146 (!) 97.5 F (36.4 C)     Temp Source 02/17/18 1146 Oral     SpO2 02/17/18 1140 93 %     Weight 02/17/18 1139 200 lb (90.7 kg)     Height 02/17/18 1139 6' (1.829 m)     Head Circumference --      Peak Flow --      Pain Score 02/17/18 1138 8     Pain Loc --      Pain Edu? --      Excl. in [place]? --     Constitutional: Alert and oriented. Well appearing and in no acute distress. Eyes: Conjunctivae are normal. PERRL. EOMI. Head: Atraumatic. Nose: No congestion/rhinnorhea. Mouth/Throat: Mucous membranes are moist.  Oropharynx non-erythematous. Neck: No stridor.  No meningeal signs.   Cardiovascular: tachycardia rate, regular rhythm. Good peripheral circulation. Grossly normal heart sounds.   Respiratory: mild resp distress. Not speaking in full sentences. Tachypneic. Minimal access muscle usage. Lungs  significantly diminished bilaterally with inspiratory and expiratory wheezing bilaterally. Mild hypoxia. Gastrointestinal: Soft and nontender. No distention.  Musculoskeletal: No lower extremity tenderness nor edema. No gross deformities of extremities. Neurologic:  Normal speech and language. No gross focal neurologic deficits are appreciated.  Skin:  Skin is warm, dry and intact. No rash noted. approx 3x3 mass to left proximal thigh with some drainage, redness and purple. Some other yellow discolored tissue associated with it. No surrounding eyrthema or tenderness.    ____________________________________________   LABS (all labs ordered are listed, but only abnormal results are displayed)  Labs Reviewed  CBC WITH DIFFERENTIAL/PLATELET - Abnormal; Notable for the following components:  Result Value   RDW 16.7 (*)    All other components within normal limits  COMPREHENSIVE METABOLIC PANEL - Abnormal; Notable for the following components:   Potassium 3.3 (*)    Glucose, Bld 125 (*)    Calcium 12.0 (*)    AST 12 (*)    All other components within normal limits  LACTIC ACID, PLASMA - Abnormal; Notable for the following components:   Lactic Acid, Venous 2.1 (*)    All other components within normal limits  URINALYSIS, ROUTINE W REFLEX MICROSCOPIC - Abnormal; Notable for the following components:   APPearance HAZY (*)    Specific Gravity, Urine >1.046 (*)    Hgb urine dipstick SMALL (*)    All other components within normal limits  LACTIC ACID, PLASMA  TROPONIN I  BRAIN NATRIURETIC PEPTIDE   ____________________________________________  EKG   EKG Interpretation  Date/Time:  Monday February 17 2018 11:42:45 EDT Ventricular Rate:  102 PR Interval:    QRS Duration: 98 QT Interval:  365 QTC Calculation: 476 R Axis:   83 Text Interpretation:  Sinus tachycardia Borderline right axis deviation Low voltage, precordial leads Nonspecific T abnormalities, anterior leads Borderline  prolonged QT interval aside from marked change in rate, no significant changes from febuary 2001 Confirmed by Merrily Pew [phone]) on 02/17/2018 1:12:38 PM       ____________________________________________  RADIOLOGY  Ct Angio Chest Pe W And/or Wo Contrast  Result Date: 02/17/2018 CLINICAL DATA:  Increasing shortness of breath over the last 2 weeks. History of lung cancer diagnosed in 2016 post radiation and chemotherapy. History of diabetes and melanoma. EXAM: CT ANGIOGRAPHY CHEST WITH CONTRAST TECHNIQUE: Multidetector CT imaging of the chest was performed using the standard protocol during bolus administration of intravenous contrast. Multiplanar CT image reconstructions and MIPs were obtained to evaluate the vascular anatomy. CONTRAST:  180mL ISOVUE-370 IOPAMIDOL (ISOVUE-370) INJECTION 76% COMPARISON:  CT 06/11/2017 and 12/26/2015. FINDINGS: Cardiovascular: The pulmonary arteries are well opacified with contrast to the level of the subsegmental branches. There are no intraluminal or peripheral filling defects within the pulmonary arteries to suggest acute pulmonary embolism. There is progressive extrinsic mass effect on the left main and upper lobe pulmonary arteries by adjacent left hilar tumor. No evidence of large vessel occlusion. There are chronic collateral vessels in the upper right chest consistent with chronic central venous stenosis. The superior vena cava remains patent. Right IJ Port-A-Cath remains in place, tip at the superior cavoatrial junction. There is mild atherosclerosis of the aorta and great vessels. The heart size is normal. There is a new small pericardial effusion with multiple areas of pericardial nodularity suspicious for pericardial metastases. Mediastinum/Nodes: There has been further enlargement of the dominant ill-defined mass within the AP window. This measures up to 7.3 x 7.2 cm on image 36/4 (previously 3.1 x 3.0 cm). This mass extends superiorly over the aortic arch,  partially encasing left common carotid and subclavian arteries. There is inferior extension into the left hilum with extrinsic mass effect on the left pulmonary artery and left central bronchi. There is no resulting lobar collapse. Right hilar soft tissue thickening appears stable. There is a stable small hiatal hernia. Lungs/Pleura: Stable small right pleural effusion. There is a chronic small right apical pneumothorax which is similar to the previous study. There is a new small left-sided pneumothorax as well apical and basilar components. There are chronic radiation changes in both perihilar regions with volume loss in the right upper lobe. There are multiple new pulmonary  nodules bilaterally. Representative nodules include a 12 mm centrally cavitary right lower lobe lesion on image 71/6 and a solid 8 mm right lower lobe nodule on image 82/6. The largest left lung nodules are in the left upper lobe, measuring 8 mm on image 80/6 and 12 x 9 mm on image 84/6. This latter nodule is also cavitary. Upper abdomen: The visualized upper abdomen appears stable without suspicious findings. Musculoskeletal/Chest wall: There is no chest wall mass or suspicious osseous finding. Review of the MIP images confirms the above findings. IMPRESSION: 1. No evidence of acute pulmonary embolism. 2. Progressive nodal mass in AP window with left hilar extension consistent with progressive metastatic lung cancer. There is resulting mass effect on the central left pulmonary arteries and bronchi. This may contribute to the patient's shortness of breath. In addition, there is new multifocal pulmonary metastatic disease. 3. New pericardial effusion with nodularity, suspicious for metastatic disease. 4. Chronic right apical pneumothorax. New left-sided pneumothorax with apical and basilar components. No evidence of tension component. 5. Sequela of chronic central venous stenosis. The SVC remains patent. 6. These results were called by telephone  at the time of interpretation on 02/17/2018 at 2:54 pm to Dr. Merrily Pew , who verbally acknowledged these results. Electronically Signed   By: Richardean Sale M.D.   On: 02/17/2018 14:55    ____________________________________________   PROCEDURES  Procedure(s) performed:   Procedures   ____________________________________________   INITIAL IMPRESSION / ASSESSMENT AND PLAN / ED COURSE  Pulmonary embolus versus viral effusion versus worsening metastatic disease versus pneumonia or cardiac causes for his symptoms.  He has slight hypoxic and tachycardic and tachypneic.  He is very high risk for point embolus we will just do a CT scan to evaluate for all of these.  EKG was sinus tachycardia but no other issues but will add on troponin to make sure no ACS.  With the diffuse diminished breath sounds and wheezing will try a DuoNeb.  His dyspnea, wheezing both improved with a couple breathing treatments.  He also had improved aeration of his lungs.  CT scan shows worsening pulmonary artery and left main bronchus impingement by the mass and increased size with no nodules.  Will discuss with his oncologist about further treatment whether he needs admission tonight which is close follow-up.  Clinical Course as of Feb 17 1845  Mon Feb 17, 2018  1549 Discussed patient case on the phone with his oncologist Dr. Mindi Junker, and discussed the findings on the CT scan as well as his response to the [facility] here.  Plan will be to ambulate on pulse ox and reevaluate and if patient is not hypoxic and ambulates well with improved symptoms Dr. Mindi Junker will see him in the office tomorrow.  If not we will call [facility] back for transfer for admission.   [JM]    Clinical Course User Index [JM] Richa Shor, Corene Cornea, MD    Patient ambulated well with normal oxygenation without respiratory issues.  Patient prefers to go home and I think this is reasonable given his normal vital signs and close follow-up with his oncologist  tomorrow at 2:00.  CD was made for the patient and he will follow-up with his oncologist.  Return here for any new or worsening symptoms in the meantime.  Pertinent labs & imaging results that were available during my care of the patient were reviewed by me and considered in my medical decision making (see chart for details).  ____________________________________________  FINAL CLINICAL IMPRESSION(S) / ED  DIAGNOSES  Final diagnoses:  Bronchitis     MEDICATIONS GIVEN DURING THIS VISIT:  Medications  lactated ringers bolus 1,000 mL (0 mLs Intravenous Stopped 02/17/18 1553)  iopamidol (ISOVUE-370) 76 % injection 150 mL (100 mLs Intravenous Contrast Given 02/17/18 1418)  ipratropium-albuterol (DUONEB) 0.5-2.5 (3) MG/3ML nebulizer solution 3 mL (3 mLs Nebulization Given 02/17/18 1448)  predniSONE (DELTASONE) tablet 60 mg (60 mg Oral Given 02/17/18 1602)  doxycycline (VIBRA-TABS) tablet 100 mg (100 mg Oral Given 02/17/18 1602)  ondansetron (ZOFRAN-ODT) disintegrating tablet 8 mg (8 mg Oral Given 02/17/18 1648)     NEW OUTPATIENT MEDICATIONS STARTED DURING THIS VISIT:  Discharge Medication List as of 02/17/2018  5:04 PM    START taking these medications   Details  doxycycline (VIBRAMYCIN) 100 MG capsule Take 1 capsule (100 mg total) by mouth 2 (two) times daily., Starting Mon 02/17/2018, Print    predniSONE (DELTASONE) 20 MG tablet 2 tabs po daily x 4 days, Print        Note:  This note was prepared with assistance of Dragon voice recognition software. Occasional wrong-word or sound-a-like substitutions may have occurred due to the inherent limitations of voice recognition software.   Jaiceon Collister, Corene Cornea, MD 02/17/18 [phone]

## 2018-02-17 NOTE — ED Triage Notes (Signed)
Pt has history of lung cancer and gets his cancer treatment at [facility].  REports worsening sob over the past few days.  C/O chest pain.

## 2018-02-17 NOTE — ED Notes (Signed)
Patient given urinal and understands we need a urine sample. He states he cannot use it right now.

## 2018-06-06 DEATH — deceased

## 2019-05-08 ENCOUNTER — Encounter (INDEPENDENT_AMBULATORY_CARE_PROVIDER_SITE_OTHER): Payer: Self-pay | Admitting: *Deleted
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
